# Patient Record
Sex: Male | Born: 1960 | Race: Black or African American | Hispanic: No
Health system: Southern US, Community
[De-identification: ages and names within clinical notes are randomized; demographics above are authoritative.]

## PROBLEM LIST (undated history)

## (undated) DIAGNOSIS — I509 Heart failure, unspecified: Secondary | ICD-10-CM

## (undated) DIAGNOSIS — M1990 Unspecified osteoarthritis, unspecified site: Secondary | ICD-10-CM

## (undated) DIAGNOSIS — R011 Cardiac murmur, unspecified: Secondary | ICD-10-CM

## (undated) DIAGNOSIS — Z9889 Other specified postprocedural states: Secondary | ICD-10-CM

## (undated) DIAGNOSIS — I10 Essential (primary) hypertension: Secondary | ICD-10-CM

## (undated) DIAGNOSIS — E119 Type 2 diabetes mellitus without complications: Secondary | ICD-10-CM

## (undated) DIAGNOSIS — R112 Nausea with vomiting, unspecified: Secondary | ICD-10-CM

## (undated) DIAGNOSIS — D649 Anemia, unspecified: Secondary | ICD-10-CM

## (undated) DIAGNOSIS — N186 End stage renal disease: Secondary | ICD-10-CM

## (undated) DIAGNOSIS — E785 Hyperlipidemia, unspecified: Secondary | ICD-10-CM

## (undated) DIAGNOSIS — E669 Obesity, unspecified: Secondary | ICD-10-CM

## (undated) HISTORY — PX: COLONOSCOPY: SHX174

## (undated) HISTORY — PX: EYE SURGERY: SHX253

## (undated) HISTORY — PX: KNEE ARTHROSCOPY: SUR90

## (undated) HISTORY — PX: KNEE ARTHROSCOPY: SHX127

## (undated) HISTORY — DX: Essential (primary) hypertension: I10

## (undated) HISTORY — DX: Unspecified osteoarthritis, unspecified site: M19.90

## (undated) HISTORY — PX: NOSE SURGERY: SHX723

## (undated) HISTORY — DX: Type 2 diabetes mellitus without complications: E11.9

---

## 2008-02-19 ENCOUNTER — Emergency Department (HOSPITAL_COMMUNITY): Admission: EM | Admit: 2008-02-19 | Discharge: 2008-02-19 | Payer: Self-pay | Admitting: Emergency Medicine

## 2008-02-22 ENCOUNTER — Ambulatory Visit: Payer: Self-pay | Admitting: Family Medicine

## 2008-02-22 ENCOUNTER — Inpatient Hospital Stay (HOSPITAL_COMMUNITY): Admission: RE | Admit: 2008-02-22 | Discharge: 2008-03-01 | Payer: Self-pay | Admitting: General Surgery

## 2009-10-16 ENCOUNTER — Encounter: Admission: RE | Admit: 2009-10-16 | Discharge: 2009-10-16 | Payer: Self-pay | Admitting: Family Medicine

## 2009-12-14 ENCOUNTER — Ambulatory Visit (HOSPITAL_COMMUNITY): Admission: RE | Admit: 2009-12-14 | Discharge: 2009-12-14 | Payer: Self-pay | Admitting: Unknown Physician Specialty

## 2010-11-28 LAB — POCT HEMOGLOBIN-HEMACUE: Hemoglobin: 15.6 g/dL (ref 13.0–17.0)

## 2010-11-28 LAB — GLUCOSE, CAPILLARY: Glucose-Capillary: 331 mg/dL — ABNORMAL HIGH (ref 70–99)

## 2011-01-22 NOTE — Op Note (Signed)
NAME:  Danny Patrick, Danny Patrick NO.:  [id number]   MEDICAL RECORD NO.:  [id number]           PATIENT TYPE:   LOCATION:                                 FACILITY:   PHYSICIAN:  Marland Kitchen T. Hoxworth, M.D.DATE OF BIRTH:  Aug 11, 1961   DATE OF PROCEDURE:  02/25/2008  DATE OF DISCHARGE:                               OPERATIVE REPORT   PREOPERATIVE DIAGNOSES:  1. Status post incision and drainage, large carbuncle posterior neck.  2. New left axillary abscess.   POSTOPERATIVE DIAGNOSES:  1. Status post incision and drainage, large carbuncle posterior neck.  2. New left axillary abscess.   SURGICAL PROCEDURES:  1. Dressing change neck wound under anesthesia.  2. I&D of left axillary abscess.   SURGEON:  Darene Lamer. Hoxworth, M.D.   ANESTHESIA:  General.   BRIEF HISTORY:  [name] is a diabetic male who underwent incision and  drainage of a very large posterior neck carbuncle by Dr. Hulen Skains 3 days  ago.  Pack remains in place.  Now has developed new area of fluctuance  tenderness and swelling in the lateral aspect of the left axilla.  I  have recommended general anesthesia for repacking of his neck wound to  I&D of left axillary abscess, and was brought to the operating room for  this procedure.   DESCRIPTION OF OPERATION:  The patient brought to the operating room,  placed supine position on the operating table and general orotracheal  anesthesia was induced.  He was then carefully rolled onto a side and  packing was removed from the posterior neck wound.  This was very large  wound about 10 cm in diameter.  All the packing was removed and the  wound was quite clean without any necrotic purulence.  It was repacked  loosely with 1-inch Iodoform gauze and sterilely redressed.  Following  this, the left axilla was sterilely prepped and draped.  Correct patient  and procedure were verified.  An area of fluctuance on the lateral  aspect of the axilla up toward the arm was  incised and fairly large  amount of pus and what appeared to be clotted blood, as well was  evacuated.  The cavity was 4-5 cm in diameter.  There was some venous  bleeding along the vein ends and was controlled with figure-of-eight  Vicryl suture with complete hemostasis.  The wound was irrigated.  There  were no loculations.  This was packed with one-quarter inch Iodoform  gauze and dry sterile dressing applied.  The patient taken to recovery  in good condition.     Darene Lamer. Hoxworth, M.D.  Electronically Signed    BTH/MEDQ  D:  02/25/2008  T:  02/25/2008  Job:  [id number]

## 2011-01-22 NOTE — Consult Note (Signed)
NAME:  Danny Patrick, Danny Patrick NO.:  [id number]   MEDICAL RECORD NO.:  [id number]          PATIENT TYPE:  INP   LOCATION:  5149                         FACILITY:  [facility]   PHYSICIAN:  Blane Ohara McDiarmid, M.D.DATE OF BIRTH:  1961-05-13   DATE OF CONSULTATION:  02/27/2008  DATE OF DISCHARGE:                                 CONSULTATION   We are asked to consult by Dr. Dalbert Batman with [facility].   REASON FOR CONSULTATION:  Diabetes.   HISTORY OF PRESENT ILLNESS:  This is a 50 year old male with diabetes  who was admitted for an I&D of a mass at the posterior neck abscess and  carbuncle.  He was also noted to have a left axillary abscess as well  and both were I&D'd in the OR.  The patient tolerated I&D, but he is now  refusing to have his dressing changed and the area packed removed.  The  patient tells me he is a type 2 diabetic.  He says he has been on  Glucotrol and Glucophage in the past and both have caused him to have  heart palpitations.  He was followed by physician in [place] who had him  on diet control and exercise according to the patient.  He states that  his CBGs at home prior to the infection were 85 to the 130s with this  regimen.  He said he had only been on insulin during hospitalizations  and with infections.   PAST MEDICAL HISTORY:  Diabetes, knee and ankle problems.  Denies any  other medical problems.   MEDICATIONS:  Currently in the hospital, he is on Cipro 400 mg IV q.12  h., clindamycin 600 mg IV q.8 h., sliding scale insulin, Lantus 30 units  q.h.s., metformin 500 mg daily, MiraLax 17 g b.i.d., Tylenol 650,  Dilaudid p.r.n., Ativan 0.5 q.6 h. p.r.n., and Zofran p.r.n.   ALLERGIES:  Penicillin.   PAST SURGICAL HISTORY:  He is status post I&D multiple times now and in  past, and right knee arthroplasty.   SOCIAL HISTORY:  He has recently moved from [place].  He lives with his  cousin.  He denies alcohol, tobacco, and drug.  He does  not work.   FAMILY HISTORY:  His mother and sister are with diabetes.  No  hypertension in the family, otherwise noncontributory.   REVIEW OF SYSTEMS:  He complains of constipation and he has not a bowel  movement awhile.  Denies chills or questionable fevers.  Denies  diarrhea.  Denies shortness of breath.  Denies chest pain.  Denies  nausea and vomiting.  Denies melena.  Denies headache.  Denies vision  changes.  Denies edema.  He does complain of neck pain.   PHYSICAL EXAMINATION:  VITAL SIGNS:  Temperature is 98.6, it is also his  T-max.  His heart rate is 80-91, his blood pressure is 108/64 with a  high of 116/88, and his O2 sats are 95% on room air.  GENERAL:  Not in acute distress, obese.  HEENT:  Extraocular muscles are intact.  His posterior scalp has  a large  bandage with blood stains on it.  This area is tender to palpation.  He  does not allow me to remove the bandage.  He has moist mucous membranes.  NECK:  Bandages in place.  CARDIOVASCULAR:  Regular rate and rhythm.  No rubs, gallops, or murmurs.  LUNGS:  Clear to auscultation bilaterally.  No wheezes or crackles.  ABDOMEN:  Obese, soft, and nontender.  MUSCULOSKELETAL:  Full range of movement and 5/5 strength of all  extremities.  EXTREMITIES:  No edema, 2+ DP pulses equal bilaterally.  His left axilla  has one side bandage in place and the area is tender to palpation.  The  patient refuses to allow me to remove the bandage to examine.  NEUROLOGIC:  Cranial nerves II-XII intact.  He is oriented x3.  SKIN:  He has no rashes.  The bandages are in place as described above  in the I&D areas.   LABORATORY DATA:  His CBGs are 400, 342, 383, and 428.  Sodium 134,  potassium 4.3, chloride 97, bicarb 29, BUN 9, and creatinine 0.84.  Platelets 366, hemoglobin A1c 12.7, white count 11.5 down from 17.6,  hemoglobin 11.5, hematocrit 38.6, and platelets 250.  An abscess of the  culture of his axilla, no growth today.  Abscess of  his posterior neck  and head culture is MSSA sensitive to Cipro and clindamycin.   ASSESSMENT:  A 50 year old male with diabetes and methicillin-sensitive  Staphylococcus aureus abscesses.  1. Abscess:  Per [facility], the primary team.  Likely,      this infection is increasing his blood glucose  Of note, the      patient is refusing dressing changes at this point, unless he is      taken to the OR.  2. Diabetes:  Uncontrolled given A1c 12.7.  Likely, this has been      going on for at least 1-2 months.  Probably, his CBGs are more      elevated due to his infection.  The patient has refused Glucotrol      and Glucophage due to side effects.  I will start glyburide 5 mg      and likely we can titrate this up to maximum of 20.  We will      increase the Lantus to 40 units at bedtime and put him on resistant      sliding scale insulin.  We will stop his metformin per his request.  3. Fluids, electrolytes, nutrition:  Low-carb diet.  4. Constipation.  We are going to try senna 2 tablets p.o. at bedtime      along with MiraLax.   DISPOSITION:  Per primary team.      Kasandra Knudsen, M.D.  Electronically Signed      Blane Ohara McDiarmid, M.D.  Electronically Signed    JT/MEDQ  D:  02/27/2008  T:  02/28/2008  Job:  [id number]

## 2011-01-22 NOTE — Op Note (Signed)
NAME:  Danny Patrick, Danny Patrick NO.:  [id number]   MEDICAL RECORD NO.:  [id number]          PATIENT TYPE:  INP   LOCATION:  [id number]                         FACILITY:  [facility]   PHYSICIAN:  Gwenyth Ober, M.D.    DATE OF BIRTH:  [birth year]   DATE OF PROCEDURE:  02/22/2008  DATE OF DISCHARGE:                               OPERATIVE REPORT   PREOPERATIVE DIAGNOSES:  1. Carbuncle/massive abscess of posterior neck.  2. Left parietal scalp abscess.   POSTOPERATIVE DIAGNOSES:  1. Carbuncle/massive abscess of posterior neck.  2. Left parietal scalp abscess.   PROCEDURE:  Incision and drainage of massive posterior neck abscess/  carbuncle and left scalp abscess.   SURGEON:  Gwenyth Ober, M.D.   ANESTHESIA:  General endotracheal.   ESTIMATED BLOOD LOSS:  100 mL.   COMPLICATIONS:  None.   CONDITION:  Fair.   INDICATIONS FOR OPERATION:  The patient is a 53-year gentleman who has  developed a massive posterior-neck abscess who now comes to the hospital  for an incision and drainage, and he also has a scalp abscess that was  being drained at the same time.   FINDINGS:  The patient had about a 100-mL sort-of necrotic abscess on  the back part of his neck which was hypervascular.  The lesion on his  left parietal scalp contained about 3-5 mL of mucopurulent fluid.   OPERATION:  The patient was taken to the operating room, placed on table  initially in the supine position.  After an adequate general  endotracheal anesthetic was administered he was placed in the right  lateral decubitus position exposing the large abscess on his neck and  also the left-scalp abscess.   We drained the neck abscess initially using #10 blade to incise in the  center of this fluctuant mass down into a deep core of brownish,  yellowish, and bloody mucoid pus.  There were multiple loculations which  were broken up with the surgeon's fingers as we irrigated out this large  cavity.  There was  significant bleeding with that, which we cauterized  and also controlled with packing.  We irrigated it with saline solution,  and we subsequently packed it with 2 bottles of 1-inch Iodoform Nu  Gauze.  The bleeding subsided with the significant packing.   We then incised and drained the left parietal scalp abscess, where 3 to  5 mL of pus were removed.  We then packed it with several inches of 1/4-  inch Iodoform Nu Gauze.  Sterile dressings were applied to both wounds.  The patient was placed back on his back prior to being extubated and  transported back to recovery room.  A large dressing was applied,  including a Kerlix which did go loosely around his neck to hold on the  4x4s and ABD dressing.      Gwenyth Ober, M.D.  Electronically Signed     JOW/MEDQ  D:  02/22/2008  T:  02/23/2008  Job:  [id number]

## 2011-01-22 NOTE — Discharge Summary (Signed)
NAME:  REDELL, FRYAR NO.:  [id number]   MEDICAL RECORD NO.:  [id number]          PATIENT TYPE:  INP   LOCATION:  5149                         FACILITY:  [facility]   PHYSICIAN:  Ebony Hail L. Lissa Merlin, N.P. DATE OF BIRTH:  [birth year]   DATE OF ADMISSION:  02/22/2008  DATE OF DISCHARGE:  03/01/2008                               DISCHARGE SUMMARY   ADMITTING PHYSICIAN:  Imogene Burn. Georgette Dover, M.D.   DISCHARGING PHYSICIAN:  Dr. Grandville Silos.   OPERATIVE PHYSICIANS:  Kathryne Eriksson. Hulen Skains, M.D. and Dr. Marland Kitchen T.  Hoxworth.   CONSULTANTS:  ER Med with the Teaching Service.   CHIEF COMPLAINT/REASON FOR ADMISSION:  Mr. Mongillo is a 50 year old male  patient, diabetes, reports as being diet and weight controlled.  He  reports a 2-week history of enlarging posterior neck abscess.  He has  developed small spontaneous amount of drainage from this area, but has  not significantly been able to resolve the abscess without medical  treatment.  On clinical exam, the patient was noted to have a 10 x 15 cm  abscess on the back of the neck.  It is swollen and erythematous and  extremely tender to touch.  The patient was admitted with a diagnosis  of,  1. Posterior neck abscess.  2. Diabetes, diet controlled.   HOSPITAL COURSE:  1. The patient was admitted and taken to the OR where he underwent an      incision and drainage of scalp and massive neck abscesses and      carbuncle, this was by Dr. Hulen Skains. He underwent incision and      drainage of the left scalp, mass of neck abscess and carbuncle.  He      was found to have a mass of 100 mL carbuncle.  Smaller right scalp      abscess, cultures were sent which later returned back positive for      methicillin-sensitive Staph aureus.  The patient had been placed      empirically on antibiotic therapy with vancomycin and Cipro.  This      was later narrowed to Cipro and clindamycin.  2. Regarding his diabetes, his glucose has remained elevated.  He was      started on the [facility] glycemic control protocol and because his      glucose were greater than 300-400 range, he was started on Lantus.      He was also complaining of inability to have a bowel movement, he      refused suppositories so he was started on MiraLax.   On postop day #2, the patient was having significant pain in the left  axilla and on the posterior neck.  His white count was 17,600.  The  packing was removed from the neck wound.  The patient did not tolerate  this well.  In the interim, he refused to have additional packing  removed without anesthesia.  The abscess under the left axilla which  began to develop after admission was likely it needed I&D and on postop  day #3, the patient was  taken back to the OR where he underwent a left  axillary abscess I&D and change of the dressing on the neck under  anesthesia.   From this point on, the patient refused to have any of the dressings  changed at the bedside despite multiple attempts to get the patient  sedation and oral pain medication at the bedside, he kept insisting on  returning to the OR.   Because of the patient's poor control in his diabetes and the fact he is  now established with primary care physician since arriving to town from  [place], Internal Medicine consult was obtained.  Teaching Service saw  the patient, did a complete evaluation on him.  The patient's hemoglobin  A1c was found to be 12.7.  The patient was subsequently started on ACE  inhibitor, lisinopril, and glyburide.  He was tried on Glucophage, but  he reported that this gave him tachypalpitations, so this was stopped.  He was also continued on the Lantus insulin and this was adjusted.  He  also received teaching and education from the diabetes education  coordinators as well as RNs at bedside and plans were to discharge the  patient home on glyburide and inject for insulin as well as continue his  ACE inhibitor.  A urine microalbumin was  checked and this was normal at  0.52.   On postop day 8/4, which was the date of the patient's discharge, repeat  labs showed a white count of 11,000, hemoglobin 1.8, platelets 289,000,  neutrophils 56%.  T-max was 99.1.  The patient was tolerating a diet,  having bowel movements.  With the assistance of the rapid response  nurse, the patient was placed under conscious sedation and subsequently  all packing was removed and not to be placed.  Oil emulsion based  dressings were placed over patient's wound to aid in comfort of removing  dressings in the future.  On the basis both wounds were clean, red, and  granular without any purulent drainage and no periwound induration or  cellulitic changes were noted.  At this point, Internal Medicine has  signed off and has left instructions regarding the patient's diabetes  management.   Because the patient is new to town and has not established with  physician, case management was asked to assist with discharge planning.  The patient has Medicare disability for back pain and he will be able to  obtain all medications via Medicare.  The patient was given the number  of [facility], so he could find a PCP in town.  He has been advised  to do this to assist with management of his diabetes.  In addition, the  patient will be instructed how to administer insulin before discharge  and we will ask for Home Health Nurse to come out to his house for  several visits to make sure he is appropriately managing his insulin in  spite of potential side effects.   FINAL DISCHARGE DIAGNOSES:  1. Posterior scalp abscess, status post incision and drainage.  2. Inferior left axillary abscess, status post intraoperative incision      and drainage.  3. Blood cultures positive for methicillin-sensitive Staph aureus.  4. Diabetes uncontrolled with poor insight into management.      Hemoglobin A1c 12.7, this admission.  5. Hypertension controlled with ACE  inhibitor.  6. Normal urine microalbumins with baseline creatinine, this admission      at 0.84.   DISCHARGE MEDICATIONS:  The patient will stop the Septra  and Vicodin, he  was taking prior to admissions.  New medications include,  1. Glyburide 10 mg daily.  2. Cipro 500 mg b.i.d. for 7 days.  3. Clindamycin 600 mg t.i.d. for 7 days.  4. Percocet 5/325 1-2 tablets 4 hours as needed for pain.  5. Protonix 40 mg daily.  6. Prinivil 2.5 mg daily.  7. Lantus insulin 40 units at bedtime, prescription also given for      syringes.  Return to work not applicable, the patient on      disability.  Diet, carb modified, diabetic appropriate diet.  Wound      care, cover wounds on neck and axilla region if draining.  8. Wound care, shower 2 to 3 times daily.  Keep wounds clean.   ACTIVITY:  May shower.  Otherwise, no restrictions.   OTHER RECOMMENDATIONS:  The patient is to check his blood sugar before  meals and at bedtime.  He is to record these numbers and bring to doctor  visits.   FOLLOW UP:  The patient is to see either Dr. Hulen Skains or Dr. Excell Seltzer in 1-  2 weeks.  For wound evaluation, he is to call [phone] for the  appointment.  He is to establish with the primary medical doctor to  follow the diabetes.  He has been given the number of [facility],  [phone] to call to find the physician name.      Sullivan Lissa Merlin, N.P.     ALE/MEDQ  D:  03/01/2008  T:  03/02/2008  Job:  [id number]   cc:   Gwenyth Ober, M.D.  Darene Lamer. Hoxworth, M.D.

## 2011-01-24 ENCOUNTER — Emergency Department (HOSPITAL_COMMUNITY)
Admission: EM | Admit: 2011-01-24 | Discharge: 2011-01-24 | Discharge status: Against Medical Advice | Payer: Medicare Other | Attending: Emergency Medicine | Admitting: Emergency Medicine

## 2011-01-24 DIAGNOSIS — Z0000 Encounter for general adult medical examination without abnormal findings: Secondary | ICD-10-CM | POA: Insufficient documentation

## 2011-01-24 DIAGNOSIS — R109 Unspecified abdominal pain: Secondary | ICD-10-CM | POA: Insufficient documentation

## 2011-01-24 LAB — GLUCOSE, CAPILLARY

## 2011-06-06 LAB — BASIC METABOLIC PANEL
BUN: 8
BUN: 9
Chloride: 98
Creatinine, Ser: 0.84
Creatinine, Ser: 0.91
GFR calc Af Amer: >60
GFR calc non Af Amer: >60
Glucose, Bld: 285 — ABNORMAL HIGH
Glucose, Bld: 366 — ABNORMAL HIGH
Potassium: 4.5
Sodium: 133 — ABNORMAL LOW

## 2011-06-06 LAB — HEMOGLOBIN A1C: Mean Plasma Glucose: 375

## 2011-06-06 LAB — COMPREHENSIVE METABOLIC PANEL
ALT: 77 — ABNORMAL HIGH
AST: 32
Albumin: 3.2 — ABNORMAL LOW
Alkaline Phosphatase: 253 — ABNORMAL HIGH
BUN: 10
Calcium: 9.6
Creatinine, Ser: 0.99
GFR calc Af Amer: >60
Glucose, Bld: 411 — ABNORMAL HIGH
Potassium: 4.2
Total Protein: 7.7

## 2011-06-06 LAB — CBC
HCT: 34 — ABNORMAL LOW
Hemoglobin: 11.5 — ABNORMAL LOW
MCHC: 34
MCHC: 34.2
MCV: 84.2
MCV: 84.3
Platelets: 289
RBC: 3.99 — ABNORMAL LOW
RBC: 4.08 — ABNORMAL LOW
RDW: 12.6
RDW: 12.7
WBC: 11 — ABNORMAL HIGH
WBC: 11.5 — ABNORMAL HIGH

## 2011-06-06 LAB — CULTURE, ROUTINE-ABSCESS

## 2011-06-06 LAB — ANAEROBIC CULTURE

## 2011-06-06 LAB — DIFFERENTIAL
Basophils Absolute: 0.1
Basophils Relative: 1
Eosinophils Relative: 2
Eosinophils Relative: 3
Lymphs Abs: 1.9
Lymphs Abs: 4
Monocytes Absolute: 1.4 — ABNORMAL HIGH
Neutro Abs: 15.7 — ABNORMAL HIGH
Neutrophils Relative %: 56

## 2011-06-06 LAB — GLUCOSE, RANDOM: Glucose, Bld: 415 — ABNORMAL HIGH

## 2011-10-22 DIAGNOSIS — M1990 Unspecified osteoarthritis, unspecified site: Secondary | ICD-10-CM | POA: Insufficient documentation

## 2011-10-22 DIAGNOSIS — G8929 Other chronic pain: Secondary | ICD-10-CM | POA: Insufficient documentation

## 2012-04-27 ENCOUNTER — Encounter: Payer: Self-pay | Admitting: Gastroenterology

## 2012-05-22 ENCOUNTER — Ambulatory Visit (INDEPENDENT_AMBULATORY_CARE_PROVIDER_SITE_OTHER): Payer: Medicare Other | Admitting: Gastroenterology

## 2012-05-22 ENCOUNTER — Encounter: Payer: Self-pay | Admitting: Gastroenterology

## 2012-05-22 VITALS — BP 140/80 | HR 96 | Ht 68.5 in | Wt 239.0 lb

## 2012-05-22 DIAGNOSIS — E119 Type 2 diabetes mellitus without complications: Secondary | ICD-10-CM

## 2012-05-22 DIAGNOSIS — R52 Pain, unspecified: Secondary | ICD-10-CM

## 2012-05-22 DIAGNOSIS — K5900 Constipation, unspecified: Secondary | ICD-10-CM

## 2012-05-22 DIAGNOSIS — R1084 Generalized abdominal pain: Secondary | ICD-10-CM

## 2012-05-22 DIAGNOSIS — K429 Umbilical hernia without obstruction or gangrene: Secondary | ICD-10-CM

## 2012-05-22 MED ORDER — NA SULFATE-K SULFATE-MG SULF 17.5-3.13-1.6 GM/177ML PO SOLN: 177.0000 mL | ORAL | Status: DC | PRN

## 2012-05-22 MED ORDER — PEG-KCL-NACL-NASULF-NA ASC-C 100 G PO SOLR: 1.0000 | Freq: Once | ORAL | Status: DC

## 2012-05-22 MED ORDER — LACTULOSE 20 G PO PACK: 20.0000 g | PACK | Freq: Three times a day (TID) | ORAL | Status: AC

## 2012-05-22 MED ORDER — LUBIPROSTONE 24 MCG PO CAPS: 24.0000 ug | ORAL_CAPSULE | Freq: Two times a day (BID) | ORAL | Status: AC

## 2012-05-22 NOTE — Progress Notes (Signed)
History of Present Illness:  This is a 51 year old African American male referred by  Ruthell Rummage for evaluation of years of worsening chronic constipation. Patient allegedly may go several weeks without a bowel movement. He does have adult onset diabetes treated with metformin 500 mg twice a day. He denies complications from his diabetes such as visual, renal, or neurological problems. Associated with his constipation is vague abdominal gas, bloating, and occasional abdominal cramping. He underwent recent CT scan of the abdomen and multiple laboratory testing, all was reviewed today was essentially normal. He has not had previous barium studies of his colon or colonoscopy.  The patient relates he tries to follow a regular diet, and alleges that he drinks liberal by mouth fluids. His had no melena, hematochezia, upper GI or hepatobiliary complaints. He denies other neurological problems or urinary voiding difficulties. He does have vague arthralgias and uses 3-4 Aleve tablets a day. Family history is noncontributory. The patient relates he is tried numerous medications which have not helped his constipation in the past. This problem has been going on for greater than 20 years.  I have reviewed this patient's present history, medical and surgical past history, allergies and medications.     ROS: The remainder of the 10 point ROS is negative... he complains of migraine headaches, chronic fatigue, periodic nosebleeds, mild insomnia, arthritis and low back pain.     Physical Exam: Blood pressure 140/80, pulse 96 and regular, and weight 239 pounds with a BMI of 35.81. I cannot appreciate stigmata of chronic liver disease, thyromegaly or lymphadenopathy. General well developed well nourished patient in no acute distress, appearing his stated age Eyes PERRLA, no icterus, fundoscopic exam per opthamologist Skin no lesions noted Neck supple, no adenopathy, no thyroid enlargement, no tenderness Chest  clear to percussion and auscultation Heart no significant murmurs, gallops or rubs noted Abdomen no hepatosplenomegaly masses or tenderness, BS normal. Umbilical hernia present with some tenderness to palpation. Rectal inspection normal no fissures, or fistulae noted.  No masses or tenderness on digital exam. Stool guaiac negative. Hard fecal impaction noted. Extremities no acute joint lesions, edema, phlebitis or evidence of cellulitis. Neurologic patient oriented x 3, cranial nerves intact, no focal neurologic deficits noted. Psychological mental status normal and normal affect.  Assessment and plan: This patient has severe constipation and probable colonic atony. I will treat him with a colonoscopy prep of stimulant laxatives today, then I have recommended a regime of Amitiza 24 mcg twice a day with Chronulac 10 g at bedtime as tolerated. Apparently in the past he has tried MiraLax,Linzess, and fiber supplements without improvement. He may need chronic senna administration. Depending on his results from colonoscopy, he also may need Sitz marker study to further define his type of constipation. I have advised him to continue liberal by mouth fluids and other meds per primary care.. Review of his CT scan was in June of 2013. His had an excellent evaluation by his primary care physician including normal TSH level. Review of his labs shows however poor control of his diabetes. After talking with this patient, it is unclear to me that he is taking his diabetic medications as recommended.   Encounter Diagnoses  Name Primary?  . Blood in stool Yes  . Constipation

## 2012-05-22 NOTE — Patient Instructions (Addendum)
You have been scheduled for a colonoscopy with propofol. Please follow written instructions given to you at your visit today.  Please pick up your prep kit at the pharmacy within the next 1-3 days. If you use inhalers (even only as needed), please bring them with you on the day of your procedure. We have given you samples of the following medication to take: Amitza, Kristolose. If they help please call for prescriptions.  CC: Eldridge Abrahams, M. D.

## 2012-06-19 ENCOUNTER — Encounter: Payer: Medicare Other | Admitting: Gastroenterology

## 2012-06-19 ENCOUNTER — Telehealth: Payer: Self-pay | Admitting: Gastroenterology

## 2012-06-19 NOTE — Telephone Encounter (Signed)
PT BILLED NO SHOW FEE-Per Dr Sharlett Iles must schedule OV prior to rescheduling Colon./yf

## 2012-10-19 DIAGNOSIS — M720 Palmar fascial fibromatosis [Dupuytren]: Secondary | ICD-10-CM | POA: Insufficient documentation

## 2012-12-02 DIAGNOSIS — Z8042 Family history of malignant neoplasm of prostate: Secondary | ICD-10-CM | POA: Insufficient documentation

## 2012-12-24 DIAGNOSIS — E291 Testicular hypofunction: Secondary | ICD-10-CM | POA: Insufficient documentation

## 2013-01-25 ENCOUNTER — Ambulatory Visit: Payer: Medicare Other | Attending: Family Medicine | Admitting: Physical Therapy

## 2013-02-02 ENCOUNTER — Encounter (HOSPITAL_COMMUNITY): Payer: Self-pay | Admitting: *Deleted

## 2013-02-02 ENCOUNTER — Emergency Department (HOSPITAL_COMMUNITY)
Admission: EM | Admit: 2013-02-02 | Discharge: 2013-02-02 | Disposition: A | Discharge status: Home / Self Care | Payer: Medicare Other | Attending: Emergency Medicine | Admitting: Emergency Medicine

## 2013-02-02 ENCOUNTER — Emergency Department (HOSPITAL_COMMUNITY): Payer: Medicare Other

## 2013-02-02 DIAGNOSIS — K5900 Constipation, unspecified: Secondary | ICD-10-CM | POA: Insufficient documentation

## 2013-02-02 DIAGNOSIS — M129 Arthropathy, unspecified: Secondary | ICD-10-CM | POA: Insufficient documentation

## 2013-02-02 DIAGNOSIS — E119 Type 2 diabetes mellitus without complications: Secondary | ICD-10-CM | POA: Insufficient documentation

## 2013-02-02 DIAGNOSIS — Z79899 Other long term (current) drug therapy: Secondary | ICD-10-CM | POA: Insufficient documentation

## 2013-02-02 LAB — CBC WITH DIFFERENTIAL/PLATELET
Basophils Absolute: 0 10*3/uL (ref 0.0–0.1)
Lymphocytes Relative: 31 % (ref 12–46)
Lymphs Abs: 2.6 10*3/uL (ref 0.7–4.0)
Neutro Abs: 5.2 10*3/uL (ref 1.7–7.7)
Neutrophils Relative %: 62 % (ref 43–77)
Platelets: 188 10*3/uL (ref 150–400)
RBC: 5.16 MIL/uL (ref 4.22–5.81)
RDW: 12.6 % (ref 11.5–15.5)
WBC: 8.4 10*3/uL (ref 4.0–10.5)

## 2013-02-02 LAB — URINALYSIS, ROUTINE W REFLEX MICROSCOPIC
Bilirubin Urine: NEGATIVE
Glucose, UA: >1000 mg/dL — AB
Hgb urine dipstick: NEGATIVE
Protein, ur: NEGATIVE mg/dL

## 2013-02-02 LAB — COMPREHENSIVE METABOLIC PANEL
ALT: 11 U/L (ref 0–53)
AST: 11 U/L (ref 0–37)
Alkaline Phosphatase: 88 U/L (ref 39–117)
CO2: 26 mEq/L (ref 19–32)
Calcium: 9.5 mg/dL (ref 8.4–10.5)
Chloride: 98 mEq/L (ref 96–112)
GFR calc non Af Amer: >90 mL/min (ref >90)
Potassium: 4 mEq/L (ref 3.5–5.1)
Sodium: 135 mEq/L (ref 135–145)

## 2013-02-02 LAB — URINE MICROSCOPIC-ADD ON: Urine-Other: NONE SEEN

## 2013-02-02 MED ORDER — ONDANSETRON HCL 4 MG/2ML IJ SOLN
4.0000 mg | Freq: Once | INTRAMUSCULAR | Status: DC
  Filled 2013-02-02: qty 2

## 2013-02-02 MED ORDER — MAGNESIUM CITRATE PO SOLN
1.0000 | Freq: Once | ORAL | Status: AC
  Administered 2013-02-02: 1 via ORAL
  Filled 2013-02-02: qty 296

## 2013-02-02 MED ORDER — SODIUM CHLORIDE 0.9 % IV BOLUS (SEPSIS)
1000.0000 mL | Freq: Once | INTRAVENOUS | Status: AC
  Administered 2013-02-02: 1000 mL via INTRAVENOUS

## 2013-02-02 MED ORDER — HYDROMORPHONE HCL PF 1 MG/ML IJ SOLN
1.0000 mg | Freq: Once | INTRAMUSCULAR | Status: DC
  Filled 2013-02-02: qty 1

## 2013-02-02 MED ORDER — IOHEXOL 300 MG/ML  SOLN
100.0000 mL | Freq: Once | INTRAMUSCULAR | Status: AC | PRN
  Administered 2013-02-02: 100 mL via INTRAVENOUS

## 2013-02-02 MED ORDER — IOHEXOL 300 MG/ML  SOLN
50.0000 mL | Freq: Once | INTRAMUSCULAR | Status: AC | PRN
  Administered 2013-02-02: 50 mL via ORAL

## 2013-02-02 NOTE — ED Provider Notes (Addendum)
History     CSN: [id number]  Arrival date & time 02/02/13  1532   First MD Initiated Contact with Patient 02/02/13 1544      Chief Complaint  Patient presents with  . Abdominal Pain    (Consider location/radiation/quality/duration/timing/severity/associated sxs/prior treatment) Patient is a 52 y.o. male presenting with abdominal pain. The history is provided by the patient.  Abdominal Pain This is a new problem. Episode onset: 4 days. The problem occurs constantly. The problem has been gradually worsening. Associated symptoms include abdominal pain. Pertinent negatives include no chest pain, no headaches and no shortness of breath. Nothing (states the eating does not improve sx) aggravates the symptoms. The symptoms are relieved by NSAIDs. Treatments tried: aleve. The treatment provided mild relief.    Past Medical History  Diagnosis Date  . Arthritis   . Diabetes mellitus     Past Surgical History  Procedure Laterality Date  . Nose surgery    . Knee arthroscopy      Bilateral   . Shoulder arthroscopy      Right     Family History  Problem Relation Age of Onset  . Colon cancer Neg Hx     History  Substance Use Topics  . Smoking status: Never Smoker   . Smokeless tobacco: Never Used  . Alcohol Use: No      Review of Systems  Constitutional: Negative for fever.  Respiratory: Negative for cough and shortness of breath.   Cardiovascular: Negative for chest pain.  Gastrointestinal: Positive for nausea and abdominal pain. Negative for vomiting, diarrhea and constipation.  Genitourinary: Negative for dysuria and frequency.  Neurological: Negative for headaches.  All other systems reviewed and are negative.    Allergies  Penicillins  Home Medications   Current Outpatient Rx  Name  Route  Sig  Dispense  Refill  . glipiZIDE (GLUCOTROL) 5 MG tablet   Oral   Take 10 mg by mouth 2 (two) times daily before a meal.         . glucose blood (FREESTYLE LITE)  test strip   Other   1 each by Other route as directed. Use as instructed         . Ibuprofen (ADVIL PO)   Oral   Take by mouth as needed.         . metFORMIN (GLUCOPHAGE) 500 MG tablet   Oral   Take 500 mg by mouth 2 (two) times daily with a meal.         . Na Sulfate-K Sulfate-Mg Sulf (SUPREP BOWEL PREP) SOLN   Oral   Take 177 mLs by mouth as needed.   1 Bottle   0     Samples of this drug were given to the patient, qu ...   . peg 3350 powder (MOVIPREP) 100 G SOLR   Oral   Take 1 kit (100 g total) by mouth once.   1 kit   0   . saxagliptin HCl (ONGLYZA) 5 MG TABS tablet   Oral   Take 5 mg by mouth daily.           There were no vitals taken for this visit.  Physical Exam  Nursing note and vitals reviewed. Constitutional: He is oriented to person, place, and time. He appears well-developed and well-nourished. No distress.  HENT:  Head: Normocephalic and atraumatic.  Mouth/Throat: Oropharynx is clear and moist.  Eyes: Conjunctivae and EOM are normal. Pupils are equal, round, and reactive to light.  Neck: Normal range of motion. Neck supple.  Cardiovascular: Normal rate, regular rhythm and intact distal pulses.   No murmur heard. Pulmonary/Chest: Effort normal and breath sounds normal. No respiratory distress. He has no wheezes. He has no rales.  Abdominal: Soft. He exhibits no distension. Bowel sounds are decreased. There is tenderness in the right upper quadrant. There is guarding and positive Murphy's sign. There is no rebound and no CVA tenderness. No hernia. Hernia confirmed negative in the right inguinal area and confirmed negative in the left inguinal area.  Generalized abd pain most pronounced in the RUQ  Musculoskeletal: Normal range of motion. He exhibits no edema and no tenderness.  Neurological: He is alert and oriented to person, place, and time.  Skin: Skin is warm and dry. No rash noted. No erythema.  Psychiatric: He has a normal mood and  affect. His behavior is normal.    ED Course  Procedures (including critical care time)  Labs Reviewed  CBC WITH DIFFERENTIAL - Abnormal; Notable for the following:    MCHC 36.6 (*)    All other components within normal limits  COMPREHENSIVE METABOLIC PANEL - Abnormal; Notable for the following:    Glucose, Bld 357 (*)    All other components within normal limits  LIPASE, BLOOD  URINALYSIS, ROUTINE W REFLEX MICROSCOPIC   Ct Abdomen Pelvis W Contrast  02/02/2013   *RADIOLOGY REPORT*  Clinical Data: Abdominal pain  CT ABDOMEN AND PELVIS WITH CONTRAST  Technique:  Multidetector CT imaging of the abdomen and pelvis was performed following the standard protocol during bolus administration of intravenous contrast.  Contrast: 161mL OMNIPAQUE IOHEXOL 300 MG/ML  SOLN, 77mL OMNIPAQUE IOHEXOL 300 MG/ML  SOLN  Comparison: None.  Findings: Lung bases are clear.  No pericardial fluid.  No focal hepatic lesion.  The gallbladder, pancreas, spleen, adrenal glands, and kidneys are normal.  No ureterolithiasis or obstructive uropathy.  Stomach, small bowel, appendix, and cecum are normal.  There is moderate volume stool throughout the colon.  Rectum is normal.  Abdominal aorta normal caliber.  No retroperitoneal periportal lymphadenopathy.  There is no free fluid the pelvis.  Prostate gland and bladder normal.  No pelvic lymphadenopathy.  There is a degenerative spurring of the spine.  No acute findings.  IMPRESSION:  1.  No acute findings in the abdomen or pelvis. 2.  Normal gallbladder and appendix. 3.  Degenerative changes of the spine.   Original Report Authenticated By: Suzy Bouchard, M.D.     1. Constipation       MDM   Patient with 4 days of abdominal pain that is intermittent but worsening. He has diffuse upper abdominal tenderness but worse in the right upper quadrant with guarding and positive Murphy's sign. He denies any vomiting, change in bowel movements or fever. He cannot think of any  exacerbating factors. He denies any prior abdominal surgeries but does have a history of diabetes and arthritis. He denies excessive alcohol use, tobacco use and has not changed any of his diabetic medications recently. He does not take excessive NSAIDs.  Concern for cholecystitis versus pancreatitis versus possible diverticulitis. CBC, CMP, lipase, UA pending. Patient given IV fluids, pain and nausea medication  7:39 PM Labs are normal here. CT shows constipation but no other acute finding      Blanchie Dessert, MD 02/02/13 1939  Blanchie Dessert, MD 02/02/13 [id number]

## 2013-02-02 NOTE — ED Notes (Signed)
Pt c/o abd pain since Saturday. Reports hx of hernia and states "I'm not sure if that is what is acting up."

## 2013-07-27 DIAGNOSIS — I10 Essential (primary) hypertension: Secondary | ICD-10-CM | POA: Insufficient documentation

## 2013-07-27 DIAGNOSIS — Z789 Other specified health status: Secondary | ICD-10-CM | POA: Insufficient documentation

## 2013-11-22 DIAGNOSIS — Z880 Allergy status to penicillin: Secondary | ICD-10-CM | POA: Insufficient documentation

## 2013-11-22 DIAGNOSIS — Y92410 Unspecified street and highway as the place of occurrence of the external cause: Secondary | ICD-10-CM | POA: Insufficient documentation

## 2013-11-22 DIAGNOSIS — S199XXA Unspecified injury of neck, initial encounter: Secondary | ICD-10-CM

## 2013-11-22 DIAGNOSIS — Y9389 Activity, other specified: Secondary | ICD-10-CM | POA: Insufficient documentation

## 2013-11-22 DIAGNOSIS — S0993XA Unspecified injury of face, initial encounter: Secondary | ICD-10-CM | POA: Insufficient documentation

## 2013-11-22 DIAGNOSIS — S99919A Unspecified injury of unspecified ankle, initial encounter: Principal | ICD-10-CM

## 2013-11-22 DIAGNOSIS — Z79899 Other long term (current) drug therapy: Secondary | ICD-10-CM | POA: Insufficient documentation

## 2013-11-22 DIAGNOSIS — Z8739 Personal history of other diseases of the musculoskeletal system and connective tissue: Secondary | ICD-10-CM | POA: Insufficient documentation

## 2013-11-22 DIAGNOSIS — E119 Type 2 diabetes mellitus without complications: Secondary | ICD-10-CM | POA: Insufficient documentation

## 2013-11-22 DIAGNOSIS — S99929A Unspecified injury of unspecified foot, initial encounter: Principal | ICD-10-CM

## 2013-11-22 DIAGNOSIS — S8990XA Unspecified injury of unspecified lower leg, initial encounter: Secondary | ICD-10-CM | POA: Insufficient documentation

## 2013-11-23 ENCOUNTER — Emergency Department (HOSPITAL_COMMUNITY)
Admission: EM | Admit: 2013-11-23 | Discharge: 2013-11-23 | Disposition: A | Discharge status: Home / Self Care | Payer: Medicare Other | Attending: Emergency Medicine | Admitting: Emergency Medicine

## 2013-11-23 ENCOUNTER — Emergency Department (HOSPITAL_COMMUNITY): Payer: Medicare Other

## 2013-11-23 ENCOUNTER — Encounter (HOSPITAL_COMMUNITY): Payer: Self-pay | Admitting: Emergency Medicine

## 2013-11-23 DIAGNOSIS — M25562 Pain in left knee: Secondary | ICD-10-CM

## 2013-11-23 DIAGNOSIS — M542 Cervicalgia: Secondary | ICD-10-CM

## 2013-11-23 MED ORDER — MELOXICAM 7.5 MG PO TABS: 15.0000 mg | ORAL_TABLET | Freq: Every day | ORAL | Status: DC

## 2013-11-23 MED ORDER — METHOCARBAMOL 500 MG PO TABS: 500.0000 mg | ORAL_TABLET | Freq: Two times a day (BID) | ORAL | Status: DC
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
## 2013-11-26 NOTE — ED Provider Notes (Signed)
Medical screening examination/treatment/procedure(s) were performed by non-physician practitioner and as supervising physician I was immediately available for consultation/collaboration.   EKG Interpretation None       Babette Relic, MD 11/26/13 [phone]

## 2014-04-07 DIAGNOSIS — M129 Arthropathy, unspecified: Secondary | ICD-10-CM | POA: Diagnosis present

## 2014-04-07 DIAGNOSIS — L02619 Cutaneous abscess of unspecified foot: Secondary | ICD-10-CM | POA: Diagnosis present

## 2014-04-07 DIAGNOSIS — E1169 Type 2 diabetes mellitus with other specified complication: Principal | ICD-10-CM | POA: Diagnosis present

## 2014-04-07 DIAGNOSIS — L03119 Cellulitis of unspecified part of limb: Secondary | ICD-10-CM

## 2014-04-07 DIAGNOSIS — D649 Anemia, unspecified: Secondary | ICD-10-CM | POA: Diagnosis present

## 2014-04-08 ENCOUNTER — Emergency Department (HOSPITAL_COMMUNITY): Payer: Medicare Other

## 2014-04-08 ENCOUNTER — Inpatient Hospital Stay (HOSPITAL_COMMUNITY)
Admission: EM | Admit: 2014-04-08 | Discharge: 2014-04-08 | DRG: 638 | Discharge status: Against Medical Advice | Payer: Medicare Other | Attending: Internal Medicine | Admitting: Internal Medicine

## 2014-04-08 ENCOUNTER — Encounter (HOSPITAL_COMMUNITY): Payer: Self-pay | Admitting: Emergency Medicine

## 2014-04-08 DIAGNOSIS — L02619 Cutaneous abscess of unspecified foot: Secondary | ICD-10-CM

## 2014-04-08 DIAGNOSIS — L03119 Cellulitis of unspecified part of limb: Secondary | ICD-10-CM

## 2014-04-08 DIAGNOSIS — D649 Anemia, unspecified: Secondary | ICD-10-CM

## 2014-04-08 DIAGNOSIS — E119 Type 2 diabetes mellitus without complications: Secondary | ICD-10-CM | POA: Diagnosis present

## 2014-04-08 DIAGNOSIS — R739 Hyperglycemia, unspecified: Secondary | ICD-10-CM

## 2014-04-08 DIAGNOSIS — L988 Other specified disorders of the skin and subcutaneous tissue: Secondary | ICD-10-CM

## 2014-04-08 DIAGNOSIS — L089 Local infection of the skin and subcutaneous tissue, unspecified: Secondary | ICD-10-CM

## 2014-04-08 DIAGNOSIS — E11628 Type 2 diabetes mellitus with other skin complications: Secondary | ICD-10-CM | POA: Diagnosis present

## 2014-04-08 DIAGNOSIS — E1169 Type 2 diabetes mellitus with other specified complication: Secondary | ICD-10-CM | POA: Diagnosis present

## 2014-04-08 DIAGNOSIS — M129 Arthropathy, unspecified: Secondary | ICD-10-CM | POA: Diagnosis present

## 2014-04-08 DIAGNOSIS — L03116 Cellulitis of left lower limb: Secondary | ICD-10-CM

## 2014-04-08 LAB — CBC WITH DIFFERENTIAL/PLATELET
BASOS PCT: 0 % (ref 0–1)
Basophils Absolute: 0 10*3/uL (ref 0.0–0.1)
EOS ABS: 0.1 10*3/uL (ref 0.0–0.7)
EOS PCT: 1 % (ref 0–5)
HCT: 33.6 % — ABNORMAL LOW (ref 39.0–52.0)
HEMOGLOBIN: 11.8 g/dL — AB (ref 13.0–17.0)
LYMPHS ABS: 1.5 10*3/uL (ref 0.7–4.0)
Lymphocytes Relative: 10 % — ABNORMAL LOW (ref 12–46)
MCH: 27.6 pg (ref 26.0–34.0)
MCHC: 35.1 g/dL (ref 30.0–36.0)
MCV: 78.7 fL (ref 78.0–100.0)
MONO ABS: 1.7 10*3/uL — AB (ref 0.1–1.0)
MONOS PCT: 11 % (ref 3–12)
NEUTROS PCT: 78 % — AB (ref 43–77)
Neutro Abs: 11.9 10*3/uL — ABNORMAL HIGH (ref 1.7–7.7)
Platelets: 184 10*3/uL (ref 150–400)
RBC: 4.27 MIL/uL (ref 4.22–5.81)
RDW: 12.5 % (ref 11.5–15.5)
WBC: 15.2 10*3/uL — ABNORMAL HIGH (ref 4.0–10.5)

## 2014-04-08 LAB — GLUCOSE, CAPILLARY: GLUCOSE-CAPILLARY: 274 mg/dL — AB (ref 70–99)

## 2014-04-08 LAB — CBG MONITORING, ED: Glucose-Capillary: 461 mg/dL — ABNORMAL HIGH (ref 70–99)

## 2014-04-08 LAB — BASIC METABOLIC PANEL
Anion gap: 18 — ABNORMAL HIGH (ref 5–15)
BUN: 14 mg/dL (ref 6–23)
CALCIUM: 9 mg/dL (ref 8.4–10.5)
CO2: 23 mEq/L (ref 19–32)
CREATININE: 1.04 mg/dL (ref 0.50–1.35)
Chloride: 91 mEq/L — ABNORMAL LOW (ref 96–112)
GFR, EST NON AFRICAN AMERICAN: 81 mL/min — AB (ref >90)
GLUCOSE: 415 mg/dL — AB (ref 70–99)
POTASSIUM: 4 meq/L (ref 3.7–5.3)
Sodium: 132 mEq/L — ABNORMAL LOW (ref 137–147)

## 2014-04-08 MED ORDER — ONDANSETRON HCL 4 MG PO TABS: 4.0000 mg | ORAL_TABLET | Freq: Four times a day (QID) | ORAL | Status: DC | PRN

## 2014-04-08 MED ORDER — ACETAMINOPHEN 325 MG PO TABS: 650.0000 mg | ORAL_TABLET | Freq: Four times a day (QID) | ORAL | Status: DC | PRN

## 2014-04-08 MED ORDER — MORPHINE SULFATE 2 MG/ML IJ SOLN: 2.0000 mg | INTRAMUSCULAR | Status: DC | PRN

## 2014-04-08 MED ORDER — LEVOFLOXACIN IN D5W 500 MG/100ML IV SOLN
500.0000 mg | INTRAVENOUS | Status: DC
  Filled 2014-04-08: qty 100

## 2014-04-08 MED ORDER — INSULIN ASPART 100 UNIT/ML ~~LOC~~ SOLN: 0.0000 [IU] | SUBCUTANEOUS | Status: DC

## 2014-04-08 MED ORDER — INSULIN ASPART 100 UNIT/ML ~~LOC~~ SOLN
10.0000 [IU] | Freq: Once | SUBCUTANEOUS | Status: AC
  Administered 2014-04-08: 10 [IU] via INTRAVENOUS
  Filled 2014-04-08: qty 1

## 2014-04-08 MED ORDER — ONDANSETRON HCL 4 MG/2ML IJ SOLN: 4.0000 mg | Freq: Four times a day (QID) | INTRAMUSCULAR | Status: DC | PRN

## 2014-04-08 MED ORDER — SODIUM CHLORIDE 0.9 % IV SOLN
INTRAVENOUS | Status: DC
  Administered 2014-04-08: 06:00:00 via INTRAVENOUS

## 2014-04-08 MED ORDER — VANCOMYCIN HCL IN DEXTROSE 1-5 GM/200ML-% IV SOLN
1000.0000 mg | Freq: Three times a day (TID) | INTRAVENOUS | Status: DC
  Filled 2014-04-08: qty 200

## 2014-04-08 MED ORDER — DOCUSATE SODIUM 100 MG PO CAPS: 100.0000 mg | ORAL_CAPSULE | Freq: Two times a day (BID) | ORAL | Status: DC

## 2014-04-08 MED ORDER — HYDROCODONE-ACETAMINOPHEN 5-325 MG PO TABS: 1.0000 | ORAL_TABLET | ORAL | Status: DC | PRN

## 2014-04-08 MED ORDER — ACETAMINOPHEN 650 MG RE SUPP
650.0000 mg | Freq: Four times a day (QID) | RECTAL | Status: DC | PRN
Start: 2014-04-08 — End: 2014-04-08

## 2014-04-08 MED ORDER — VANCOMYCIN HCL IN DEXTROSE 1-5 GM/200ML-% IV SOLN
1000.0000 mg | Freq: Once | INTRAVENOUS | Status: AC
  Administered 2014-04-08: 1000 mg via INTRAVENOUS
  Filled 2014-04-08: qty 200

## 2014-04-08 NOTE — ED Notes (Signed)
Spoke with Dr. Roxanne Mins regarding pt's foot.  Verbal orders given for CBC and BMET.

## 2014-04-08 NOTE — ED Notes (Signed)
Further examination of foot revealed discoloration of top of foot with areas which appear to be fluid filled. Focal point appears to be left 4th toe. Foul odor noted coming from foot upon inspection. Patient reports diabetes history.

## 2014-04-08 NOTE — ED Notes (Signed)
Tilda Burrow RN notified of CBG results 461 mg/dL
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
## 2014-04-08 NOTE — Progress Notes (Signed)
Pt arrived on unit, alert and oriented. Able to make needs known. Oriented to room and staff. RFA IV clean dry and intact. Noted cellulitis on L foot. We will treat per MD order.

## 2014-04-08 NOTE — Progress Notes (Signed)
Received pt report from Anna,RN-ED.

## 2014-04-08 NOTE — Progress Notes (Signed)
Utilization review completed. Pammy Vesey, RN, BSN. 

## 2014-04-08 NOTE — Progress Notes (Signed)
Pt left from the unit without signing AMA sheet even 2RNs explained the risks associated with leaving the facility. [place],MD made aware of pt decision. IV d/c'd.Pt took his personal belongings with him.
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
## 2014-04-08 NOTE — H&P (Signed)
PCP:   [name], NP    Chief Complaint:  Left foot swelling  HPI: Danny Patrick. is a 53 y.o. male   has a past medical history of Arthritis and Diabetes mellitus.   Presented with  patient states over past 3 days his foot has began to swell with some erythema and blistering on the top. He have had some chills but no fever. Patient is a diabetic not on insulin. Reports poor BG control over past few days.  Plain films showing no evidence of osteomyelitis.  Hospitalist was called for admission for diabetic foot infection  Review of Systems:    Pertinent positives include: Fevers, chills,  Constitutional:  No weight loss, night sweats,  fatigue, weight loss  HEENT:  No headaches, Difficulty swallowing,Tooth/dental problems,Sore throat,  No sneezing, itching, ear ache, nasal congestion, post nasal drip,  Cardio-vascular:  No chest pain, Orthopnea, PND, anasarca, dizziness, palpitations.no Bilateral lower extremity swelling  GI:  No heartburn, indigestion, abdominal pain, nausea, vomiting, diarrhea, change in bowel habits, loss of appetite, melena, blood in stool, hematemesis Resp:  no shortness of breath at rest. No dyspnea on exertion, No excess mucus, no productive cough, No non-productive cough, No coughing up of blood.No change in color of mucus.No wheezing. Skin:  no rash or lesions. No jaundice GU:  no dysuria, change in color of urine, no urgency or frequency. No straining to urinate.  No flank pain.  Musculoskeletal:  No joint pain or no joint swelling. No decreased range of motion. No back pain.  Psych:  No change in mood or affect. No depression or anxiety. No memory loss.  Neuro: no localizing neurological complaints, no tingling, no weakness, no double vision, no gait abnormality, no slurred speech, no confusion  Otherwise ROS are negative except for above, 10 systems were reviewed  Past Medical History: Past Medical History  Diagnosis Date  .  Arthritis   . Diabetes mellitus    Past Surgical History  Procedure Laterality Date  . Nose surgery    . Knee arthroscopy      Bilateral   . Shoulder arthroscopy      Right      Medications: Prior to Admission medications   Medication Sig Start Date End Date Taking? Authorizing Provider  glipiZIDE (GLUCOTROL) 5 MG tablet Take 10 mg by mouth 2 (two) times daily before a meal.   Yes Historical Provider, MD  metFORMIN (GLUCOPHAGE) 500 MG tablet Take 500 mg by mouth 2 (two) times daily with a meal.   Yes Historical Provider, MD    Allergies:   Allergies  Allergen Reactions  . Chocolate Other (See Comments)    migraines  . Penicillins Swelling    Pain     Social History:  Ambulatory independently Lives at home alone    reports that he has never smoked. He has never used smokeless tobacco. He reports that he does not drink alcohol or use illicit drugs.    Family History: family history includes Diabetes type II in his mother; Multiple sclerosis in his sister. There is no history of Colon cancer.    Physical Exam: Patient Vitals for the past 24 hrs:  BP Temp Temp src Pulse Resp SpO2 Height Weight  04/08/14 0330 133/69 mmHg - - 106 21 97 % - -  04/08/14 0216 136/76 mmHg - - 105 23 98 % - -  04/08/14 0202 134/71 mmHg - - 105 - 100 % - -  04/07/14 2352 162/82 mmHg  98.7 F (37.1 C) Oral 101 16 98 % 5\' 7"  (1.702 m) 102.059 kg (225 lb)    1. General:  in No Acute distress 2. Psychological: Alert and  Oriented 3. Head/ENT:   Moist  Mucous Membranes                          Head Non traumatic, neck supple                          NormalDentition 4. SKIN: normalSkin turgor,  Skin clean Dry left foot significant for bulla formation on the dorsum with erythema and heat 5. Heart: Regular rate and rhythm no Murmur, Rub or gallop 6. Lungs: Clear to auscultation bilaterally, no wheezes or crackles   7. Abdomen: Soft, non-tender, Non distended 8. Lower extremities: no clubbing,  cyanosis, or edema on the right, left foot appears swollen.   9. Neurologically Grossly intact, moving all 4 extremities equally 10. MSK: Normal range of motion  body mass index is 35.23 kg/(m^2).   Labs on Admission:   Recent Labs  04/08/14 0227  NA 132*  K 4.0  CL 91*  CO2 23  GLUCOSE 415*  BUN 14  CREATININE 1.04  CALCIUM 9.0   No results found for this basename: AST, ALT, ALKPHOS, BILITOT, PROT, ALBUMIN,  in the last 72 hours No results found for this basename: LIPASE, AMYLASE,  in the last 72 hours  Recent Labs  04/08/14 0227  WBC 15.2*  NEUTROABS 11.9*  HGB 11.8*  HCT 33.6*  MCV 78.7  PLT 184   No results found for this basename: CKTOTAL, CKMB, CKMBINDEX, TROPONINI,  in the last 72 hours No results found for this basename: TSH, T4TOTAL, FREET3, T3FREE, THYROIDAB,  in the last 72 hours No results found for this basename: VITAMINB12, FOLATE, FERRITIN, TIBC, IRON, RETICCTPCT,  in the last 72 hours Lab Results  Component Value Date   HGBA1C  Value: 12.7 (NOTE)   The ADA recommends the following therapeutic goals for glycemic   control related to Hgb A1C measurement:   Goal of Therapy:   < 7.0% Hgb A1C   Action Suggested:  > 8.0% Hgb A1C   Ref:  Diabetes Care, 22, Suppl. 1, 1999* 02/24/2008    Estimated Creatinine Clearance: 95.7 ml/min (by C-G formula based on Cr of 1.04). ABG No results found for this basename: phart, pco2, po2, hco3, tco2, acidbasedef, o2sat     No results found for this basename: DDIMER    BNP (last 3 results) No results found for this basename: PROBNP,  in the last 8760 hours  Filed Weights   04/07/14 2352  Weight: 102.059 kg (225 lb)     Cultures:    Component Value Date/Time   SDES ABSCESS 02/25/2008 1004   SDES ABSCESS 02/25/2008 1004   [place], PT ON VANCO 02/25/2008 1004   SPECREQUEST LEFT AXILLA, PT ON VANCO 02/25/2008 1004   CULT NO ANAEROBES ISOLATED 02/25/2008 1004   CULT  Value: MULTIPLE ORGANISMS PRESENT,  NONE PREDOMINANT Note: NO STAPHYLOCOCCUS AUREUS ISOLATED NO GROUP A STREP (S.PYOGENES) ISOLATED 02/25/2008 1004   REPTSTATUS 03/01/2008 FINAL 02/25/2008 1004   REPTSTATUS 02/28/2008 FINAL 02/25/2008 1004         Radiological Exams on Admission: Dg Foot Complete Left  04/08/2014   CLINICAL DATA:  Left foot pain and swelling. Redness and infection around the fourth toe.  EXAM: LEFT FOOT -  COMPLETE 3+ VIEW  COMPARISON:  None.  FINDINGS: Degenerative changes in the interphalangeal joints and first metatarsal-phalangeal joint. Degenerative changes in the intertarsal joints. No evidence of acute fracture or dislocation. No bone erosions. Soft tissues are unremarkable.  IMPRESSION: Degenerative changes in the left foot. No acute bony abnormalities appreciated.   Electronically Signed   By: Lucienne Capers M.D.   On: 04/08/2014 04:00    Chart has been reviewed  Assessment/Plan  53 yo M with diabetes and left foot cellulitis being evaluated for osteomyelitis   Present on Admission:  . Diabetic foot infection - cover with vanc and Levaquin, patient is PEN allergic. MRI in AM . Diabetes mellitus - hold PO meds, ordered ssi   Prophylaxis: SCD, Protonix  CODE STATUS:  FULL CODE  Other plan as per orders.  I have spent a total of 55 min on this admission  Diamon Reddinger 04/08/2014, 4:12 AM  [facility]  Pager [phone]   If 7AM-7PM, please contact the day team taking care of the patient  [email or web address]  Password [email or web address]

## 2014-04-08 NOTE — Progress Notes (Signed)
Pt c/o being hungry. Explained that pt diet is NPO for possible surgery. Pt stated that he wasn't told in the ED for possible procedure done. Pt was aggravated, he threatened to leave. [place],MD made aware.

## 2014-04-08 NOTE — ED Provider Notes (Signed)
CSN: [id number]     Arrival date & time 04/07/14  2338 History   First MD Initiated Contact with Patient 04/08/14 [phone]     Chief Complaint  Patient presents with  . Leg Swelling     (Consider location/radiation/quality/duration/timing/severity/associated sxs/prior Treatment) The history is provided by the patient.   53 year old male who is diabetic has noted swelling in his left foot and lower leg over the last 3 days. This seems to be getting worse. He denies pain and denies fever but has had chills and sweats. He denies any trauma.  Past Medical History  Diagnosis Date  . Arthritis   . Diabetes mellitus    Past Surgical History  Procedure Laterality Date  . Nose surgery    . Knee arthroscopy      Bilateral   . Shoulder arthroscopy      Right    Family History  Problem Relation Age of Onset  . Colon cancer Neg Hx    History  Substance Use Topics  . Smoking status: Never Smoker   . Smokeless tobacco: Never Used  . Alcohol Use: No    Review of Systems  All other systems reviewed and are negative.     Allergies  Chocolate and Penicillins  Home Medications   Prior to Admission medications   Medication Sig Start Date End Date Taking? Authorizing Provider  glipiZIDE (GLUCOTROL) 5 MG tablet Take 10 mg by mouth 2 (two) times daily before a meal.   Yes Historical Provider, MD  metFORMIN (GLUCOPHAGE) 500 MG tablet Take 500 mg by mouth 2 (two) times daily with a meal.   Yes Historical Provider, MD   BP 136/76  Pulse 105  Temp(Src) 98.7 F (37.1 C) (Oral)  Resp 23  Ht 5\' 7"  (1.702 m)  Wt 225 lb (102.059 kg)  BMI 35.23 kg/m2  SpO2 98% Physical Exam  Nursing note and vitals reviewed.  53 year old male, resting comfortably and in no acute distress. Vital signs are significant for borderline tachycardia with heart rate 105, and mild tachypnea with respiratory rate of 23. Oxygen saturation is 98%, which is normal. Head is normocephalic and atraumatic. PERRLA, EOMI.  Oropharynx is clear. Neck is nontender and supple without adenopathy or JVD. Back is nontender and there is no CVA tenderness. Lungs are clear without rales, wheezes, or rhonchi. Chest is nontender. Heart has regular rate and rhythm with 2/6 systolic ejection murmur along the left sternal border. Abdomen is soft, flat, nontender without masses or hepatosplenomegaly and peristalsis is normoactive. Extremities: Left fourth toe is swollen and intensely erythematous. There is a vesicle which is over the dorsum of the proximal phalanx of the toe and extends onto the dorsum of the foot. The entire left foot has moderate swelling and moderate erythema and is warm to touch. Swelling extends into the left calf which is approximately 3 cm greater in circumference than the right calf. There no cords palpable but the calf is still warm to touch compared with the contralateral side. No lymphangitic streaks are seen and there is no inguinal adenopathy. Skin is warm and dry without rash. Neurologic: Mental status is normal, cranial nerves are intact, there are no motor or sensory deficits. No evidence of peripheral neuropathy.  ED Course  Procedures (including critical care time) Labs Review Labs Reviewed  CBC WITH DIFFERENTIAL - Abnormal; Notable for the following:    WBC 15.2 (*)    Hemoglobin 11.8 (*)    HCT 33.6 (*)  Neutrophils Relative % 78 (*)    Neutro Abs 11.9 (*)    Lymphocytes Relative 10 (*)    Monocytes Absolute 1.7 (*)    All other components within normal limits  BASIC METABOLIC PANEL - Abnormal; Notable for the following:    Sodium 132 (*)    Chloride 91 (*)    Glucose, Bld 415 (*)    GFR calc non Af Amer 81 (*)    Anion gap 18 (*)    All other components within normal limits  CBG MONITORING, ED - Abnormal; Notable for the following:    Glucose-Capillary 461 (*)    All other components within normal limits    Imaging Review Dg Foot Complete Left  04/08/2014   CLINICAL DATA:   Left foot pain and swelling. Redness and infection around the fourth toe.  EXAM: LEFT FOOT - COMPLETE 3+ VIEW  COMPARISON:  None.  FINDINGS: Degenerative changes in the interphalangeal joints and first metatarsal-phalangeal joint. Degenerative changes in the intertarsal joints. No evidence of acute fracture or dislocation. No bone erosions. Soft tissues are unremarkable.  IMPRESSION: Degenerative changes in the left foot. No acute bony abnormalities appreciated.   Electronically Signed   By: Lucienne Capers M.D.   On: 04/08/2014 04:00   MDM   Final diagnoses:  Cellulitis of left foot  Hyperglycemia  Normochromic normocytic anemia    Cellulitis of the left foot and leg. Original infection appears to be in the left fourth toe. He is started on antibiotics of vancomycin. Laboratory workup has shown moderate hyperglycemia with blood sugar 415 and leukocytosis with WBC of 15.2 as well as anemia with hemoglobin of 11.8. Case is discussed with Dr. Roel Cluck of [facility] who agrees to admit the patient.     Delora Fuel, MD [id number] [id number]

## 2014-04-08 NOTE — Discharge Summary (Signed)
Was notified by nursing staff that patient threatening to leave AMA. Patient states he is hungry and wants to eat. Patient has been explained and he was made n.p.o. because of possibility of needing an  operative intervention on his foot. Was notified that patient left AMA. No prescriptions were provided.  Danny Patrick 7:01 AM

## 2014-04-11 LAB — GLUCOSE, CAPILLARY: GLUCOSE-CAPILLARY: 401 mg/dL — AB (ref 70–99)
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
## 2015-07-16 DIAGNOSIS — E559 Vitamin D deficiency, unspecified: Secondary | ICD-10-CM | POA: Insufficient documentation

## 2015-07-16 DIAGNOSIS — E1165 Type 2 diabetes mellitus with hyperglycemia: Secondary | ICD-10-CM | POA: Insufficient documentation

## 2016-01-02 DIAGNOSIS — E1129 Type 2 diabetes mellitus with other diabetic kidney complication: Secondary | ICD-10-CM | POA: Insufficient documentation

## 2016-02-21 DIAGNOSIS — K5909 Other constipation: Secondary | ICD-10-CM | POA: Insufficient documentation

## 2016-02-21 DIAGNOSIS — Z8371 Family history of colonic polyps: Secondary | ICD-10-CM | POA: Insufficient documentation

## 2016-03-21 DIAGNOSIS — M5020 Other cervical disc displacement, unspecified cervical region: Secondary | ICD-10-CM | POA: Insufficient documentation

## 2016-06-14 DIAGNOSIS — E113519 Type 2 diabetes mellitus with proliferative diabetic retinopathy with macular edema, unspecified eye: Secondary | ICD-10-CM | POA: Insufficient documentation

## 2017-12-16 ENCOUNTER — Ambulatory Visit (INDEPENDENT_AMBULATORY_CARE_PROVIDER_SITE_OTHER): Payer: Medicare Other | Admitting: Podiatry

## 2017-12-16 DIAGNOSIS — E1165 Type 2 diabetes mellitus with hyperglycemia: Secondary | ICD-10-CM

## 2017-12-16 DIAGNOSIS — B351 Tinea unguium: Secondary | ICD-10-CM | POA: Diagnosis not present

## 2017-12-16 DIAGNOSIS — M79675 Pain in left toe(s): Secondary | ICD-10-CM

## 2017-12-16 DIAGNOSIS — M79674 Pain in right toe(s): Secondary | ICD-10-CM

## 2017-12-16 NOTE — Patient Instructions (Signed)

## 2017-12-17 NOTE — Progress Notes (Signed)
Subjective:   Patient ID: Danny Patrick., male   DOB: 57 y.o.   MRN: [id number]   HPI 57 year old male presents the office today for concerns of thick, painful, elongated toenails that he cannot trim himself.  He denies any redness or drainage or increased to the toenail sites.  The nails do cause pain with pressure in shoes.  He is diabetic and his last A1c was 14.  He has no other concerns today.  Denies any recent ulceration although he does have a history of an ulceration of the left foot about 4 years ago.   Review of Systems  All other systems reviewed and are negative.  Past Medical History:  Diagnosis Date  . Arthritis   . Diabetes mellitus     Past Surgical History:  Procedure Laterality Date  . KNEE ARTHROSCOPY     Bilateral   . NOSE SURGERY    . SHOULDER ARTHROSCOPY     Right      Current Outpatient Medications:  .  glipiZIDE (GLUCOTROL) 5 MG tablet, Take 10 mg by mouth 2 (two) times daily before a meal., Disp: , Rfl:  .  metFORMIN (GLUCOPHAGE) 500 MG tablet, Take 500 mg by mouth 2 (two) times daily with a meal., Disp: , Rfl:   Allergies  Allergen Reactions  . Chocolate Other (See Comments)    migraines  . Penicillins Swelling    Pain     Social History   Socioeconomic History  . Marital status: Divorced    Spouse name: Not on file  . Number of children: 2  . Years of education: Not on file  . Highest education level: Not on file  Occupational History  . Occupation: Unemployed   Social Needs  . Financial resource strain: Not on file  . Food insecurity:    Worry: Not on file    Inability: Not on file  . Transportation needs:    Medical: Not on file    Non-medical: Not on file  Tobacco Use  . Smoking status: Never Smoker  . Smokeless tobacco: Never Used  Substance and Sexual Activity  . Alcohol use: No  . Drug use: No  . Sexual activity: Not on file  Lifestyle  . Physical activity:    Days per week: Not on file    Minutes per session: Not on  file  . Stress: Not on file  Relationships  . Social connections:    Talks on phone: Not on file    Gets together: Not on file    Attends religious service: Not on file    Active member of club or organization: Not on file    Attends meetings of clubs or organizations: Not on file    Relationship status: Not on file  . Intimate partner violence:    Fear of current or ex partner: Not on file    Emotionally abused: Not on file    Physically abused: Not on file    Forced sexual activity: Not on file  Other Topics Concern  . Not on file  Social History Narrative   No caffeine          Objective:  Physical Exam  General: AAO x3, NAD  Dermatological: Nails are hypertrophic, dystrophic, brittle, discolored, elongated 10. No surrounding redness or drainage. Tenderness nails 1-5 bilaterally. No open lesions or pre-ulcerative lesions are identified today.  Vascular: Dorsalis Pedis artery and Posterior Tibial artery pedal pulses are 2/4 bilateral with immedate capillary fill time. There  is no pain with calf compression, swelling, warmth, erythema.   Neruologic: Grossly intact via light touch bilateral.  Protective threshold with Semmes Wienstein monofilament intact to all pedal sites bilateral.   Musculoskeletal: No gross boney pedal deformities bilateral. No pain, crepitus, or limitation noted with foot and ankle range of motion bilateral. Muscular strength 5/5 in all groups tested bilateral.  Gait: Unassisted, Nonantalgic.       Assessment:   57 year old uncontrolled diabetic with symptomatic onychomycosis     Plan:  -Treatment options discussed including all alternatives, risks, and complications -Etiology of symptoms were discussed -Nails debrided 10 without complications or bleeding. Discussed treatment options for onychomycosis per this point we will-proceed with routine debridements. -Daily foot inspection -Follow-up in 3 months or sooner if any problems arise. In the  meantime, encouraged to call the office with any questions, concerns, change in symptoms.   Celesta Gentile, DPM

## 2017-12-23 ENCOUNTER — Encounter: Payer: Self-pay | Admitting: *Deleted

## 2017-12-24 ENCOUNTER — Encounter: Payer: Self-pay | Admitting: Neurology

## 2017-12-24 ENCOUNTER — Ambulatory Visit (INDEPENDENT_AMBULATORY_CARE_PROVIDER_SITE_OTHER): Payer: Medicare Other | Admitting: Neurology

## 2017-12-24 VITALS — BP 140/80 | HR 100 | Ht 69.0 in | Wt 261.0 lb

## 2017-12-24 DIAGNOSIS — M5416 Radiculopathy, lumbar region: Secondary | ICD-10-CM

## 2017-12-24 NOTE — Patient Instructions (Signed)
[place] for lumbar spine injections EMG/NCS   Back Pain, Adult Back pain is very common. The pain often gets better over time. The cause of back pain is usually not dangerous. Most people can learn to manage their back pain on their own. Follow these instructions at home: Watch your back pain for any changes. The following actions may help to lessen any pain you are feeling:  Stay active. Start with short walks on flat ground if you can. Try to walk farther each day.  Exercise regularly as told by your doctor. Exercise helps your back heal faster. It also helps avoid future injury by keeping your muscles strong and flexible.  Do not sit, drive, or stand in one place for more than 30 minutes.  Do not stay in bed. Resting more than 1-2 days can slow down your recovery.  Be careful when you bend or lift an object. Use good form when lifting: ? Bend at your knees. ? Keep the object close to your body. ? Do not twist.  Sleep on a firm mattress. Lie on your side, and bend your knees. If you lie on your back, put a pillow under your knees.  Take medicines only as told by your doctor.  Put ice on the injured area. ? Put ice in a plastic bag. ? Place a towel between your skin and the bag. ? Leave the ice on for 20 minutes, 2-3 times a day for the first 2-3 days. After that, you can switch between ice and heat packs.  Avoid feeling anxious or stressed. Find good ways to deal with stress, such as exercise.  Maintain a healthy weight. Extra weight puts stress on your back.  Contact a doctor if:  You have pain that does not go away with rest or medicine.  You have worsening pain that goes down into your legs or buttocks.  You have pain that does not get better in one week.  You have pain at night.  You lose weight.  You have a fever or chills. Get help right away if:  You cannot control when you poop (bowel movement) or pee (urinate).  Your arms or  legs feel weak.  Your arms or legs lose feeling (numbness).  You feel sick to your stomach (nauseous) or throw up (vomit).  You have belly (abdominal) pain.  You feel like you may pass out (faint). This information is not intended to replace advice given to you by your health care provider. Make sure you discuss any questions you have with your health care provider. Document Released: 02/12/2008 Document Revised: 02/01/2016 Document Reviewed: 12/28/2013 Elsevier Interactive Patient Education  2018 [place] is a test to check how well your muscles and nerves are working. This procedure includes the combined use of electromyogram (EMG) and nerve conduction study (NCS). EMG is used to look for muscular disorders. NCS, which is also called electroneurogram, measures how well your nerves are controlling your muscles. The procedures are usually performed together to check if your muscles and nerves are healthy. If the reaction to testing is abnormal, this can indicate disease or injury, such as peripheral nerve damage. Tell a health care provider about:  Any allergies you have.  All medicines you are taking, including vitamins, herbs, eye drops, creams, and over-the-counter medicines.  Any problems you or family members have had with anesthetic medicines.  Any blood disorders you have.  Any surgeries you have had.  Any medical  conditions you have.  Any pacemaker you have. What are the risks? Generally, this is a safe procedure. However, problems may occur, including:  Infection where the electrodes were inserted.  Bleeding.  What happens before the procedure?  Ask your health care provider about: ? Changing or stopping your regular medicines. This is especially important if you are taking diabetes medicines or blood thinners. ? Taking medicines such as aspirin and ibuprofen. These medicines can thin your blood. Do not take these  medicines before your procedure if your health care provider instructs you not to.  Your health care provider may ask you to avoid: ? Caffeine, such as coffee and tea. ? Nicotine. This includes cigarettes and anything with tobacco.  Do not use lotions or creams on the same day that you will be having the procedure. What happens during the procedure? For EMG:  Your health care provider will ask you to stay in a position so that he or she can access the muscle that will be studied. You may be standing, sitting down, or lying down.  You may be given a medicine that numbs the area (local anesthetic).  A very thin needle that has an electrode on it will be inserted into your muscle.  Another small electrode will be placed on your skin near the muscle.  Your health care provider will ask you to continue to remain still.  The electrodes will send a signal that tells about the electrical activity of your muscles. You may see this on a monitor or hear it in the room.  After your muscles have been studied at rest, your health care provider will ask you to contract or flex your muscles. The electrodes will send a signal that tells about the electrical activity of your muscles.  Your health care provider will remove the electrodes and the electrode needles when the procedure is finished. The procedure may vary among health care providers and hospitals. For NCS:  An electrode that records your nerve activity (recording electrode) will be placed on your skin by the muscle that is being studied.  An electrode that is used as a reference (reference electrode) will be placed near the recording electrode.  A paste or gel will be applied to your skin between the recording electrode and the reference electrode.  Your nerve will be stimulated with a mild shock. Your health care provider will measure how much time it takes for your muscle to react.  Your health care provider will remove the electrodes  and the gel when the procedure is finished. The procedure may vary among health care providers and hospitals. What happens after the procedure?  It is your responsibility to obtain your test results. Ask your health care provider or the department performing the test when and how you will get your results.  Your health care provider may: ? Give you medicines for any pain. ? Monitor the insertion sites to make sure that they stop bleeding. This information is not intended to replace advice given to you by your health care provider. Make sure you discuss any questions you have with your health care provider. Document Released: 12/27/2004 Document Revised: 02/01/2016 Document Reviewed: 10/17/2014 Elsevier Interactive Patient Education  2018 [place] in the Home Falls can cause injuries. They can happen to people of all ages. There are many things you can do to make your home safe and to help prevent falls. What can I do on the outside of my home?  Regularly fix the edges of walkways and driveways and fix any cracks.  Remove anything that might make you trip as you walk through a door, such as a raised step or threshold.  Trim any bushes or trees on the path to your home.  Use bright outdoor lighting.  Clear any walking paths of anything that might make someone trip, such as rocks or tools.  Regularly check to see if handrails are loose or broken. Make sure that both sides of any steps have handrails.  Any raised decks and porches should have guardrails on the edges.  Have any leaves, snow, or ice cleared regularly.  Use sand or salt on walking paths during winter.  Clean up any spills in your garage right away. This includes oil or grease spills. What can I do in the bathroom?  Use night lights.  Install grab bars by the toilet and in the tub and shower. Do not use towel bars as grab bars.  Use non-skid mats or decals in the tub or shower.  If you need  to sit down in the shower, use a plastic, non-slip stool.  Keep the floor dry. Clean up any water that spills on the floor as soon as it happens.  Remove soap buildup in the tub or shower regularly.  Attach bath mats securely with double-sided non-slip rug tape.  Do not have throw rugs and other things on the floor that can make you trip. What can I do in the bedroom?  Use night lights.  Make sure that you have a light by your bed that is easy to reach.  Do not use any sheets or blankets that are too big for your bed. They should not hang down onto the floor.  Have a firm chair that has side arms. You can use this for support while you get dressed.  Do not have throw rugs and other things on the floor that can make you trip. What can I do in the kitchen?  Clean up any spills right away.  Avoid walking on wet floors.  Keep items that you use a lot in easy-to-reach places.  If you need to reach something above you, use a strong step stool that has a grab bar.  Keep electrical cords out of the way.  Do not use floor polish or wax that makes floors slippery. If you must use wax, use non-skid floor wax.  Do not have throw rugs and other things on the floor that can make you trip. What can I do with my stairs?  Do not leave any items on the stairs.  Make sure that there are handrails on both sides of the stairs and use them. Fix handrails that are broken or loose. Make sure that handrails are as long as the stairways.  Check any carpeting to make sure that it is firmly attached to the stairs. Fix any carpet that is loose or worn.  Avoid having throw rugs at the top or bottom of the stairs. If you do have throw rugs, attach them to the floor with carpet tape.  Make sure that you have a light switch at the top of the stairs and the bottom of the stairs. If you do not have them, ask someone to add them for you. What else can I do to help prevent falls?  Wear shoes that: ? Do  not have high heels. ? Have rubber bottoms. ? Are comfortable and fit you well. ? Are closed at  the toe. Do not wear sandals.  If you use a stepladder: ? Make sure that it is fully opened. Do not climb a closed stepladder. ? Make sure that both sides of the stepladder are locked into place. ? Ask someone to hold it for you, if possible.  Clearly mark and make sure that you can see: ? Any grab bars or handrails. ? First and last steps. ? Where the edge of each step is.  Use tools that help you move around (mobility aids) if they are needed. These include: ? Canes. ? Walkers. ? Scooters. ? Crutches.  Turn on the lights when you go into a dark area. Replace any light bulbs as soon as they burn out.  Set up your furniture so you have a clear path. Avoid moving your furniture around.  If any of your floors are uneven, fix them.  If there are any pets around you, be aware of where they are.  Review your medicines with your doctor. Some medicines can make you feel dizzy. This can increase your chance of falling. Ask your doctor what other things that you can do to help prevent falls. This information is not intended to replace advice given to you by your health care provider. Make sure you discuss any questions you have with your health care provider. Document Released: 06/22/2009 Document Revised: 02/01/2016 Document Reviewed: 09/30/2014 Elsevier Interactive Patient Education  [employer].
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
## 2018-01-08 ENCOUNTER — Encounter (INDEPENDENT_AMBULATORY_CARE_PROVIDER_SITE_OTHER): Payer: Self-pay | Admitting: Physical Medicine and Rehabilitation

## 2018-01-20 ENCOUNTER — Ambulatory Visit: Payer: Medicare Other | Admitting: Diagnostic Neuroimaging

## 2018-01-26 ENCOUNTER — Ambulatory Visit (INDEPENDENT_AMBULATORY_CARE_PROVIDER_SITE_OTHER): Payer: Medicare Other | Admitting: Physical Medicine and Rehabilitation

## 2018-01-26 ENCOUNTER — Ambulatory Visit (INDEPENDENT_AMBULATORY_CARE_PROVIDER_SITE_OTHER): Payer: Medicare Other

## 2018-01-26 ENCOUNTER — Encounter

## 2018-01-26 ENCOUNTER — Encounter (INDEPENDENT_AMBULATORY_CARE_PROVIDER_SITE_OTHER): Payer: Self-pay | Admitting: Physical Medicine and Rehabilitation

## 2018-01-26 VITALS — BP 182/99 | HR 95

## 2018-01-26 DIAGNOSIS — M5416 Radiculopathy, lumbar region: Secondary | ICD-10-CM

## 2018-01-26 MED ORDER — BETAMETHASONE SOD PHOS & ACET 6 (3-3) MG/ML IJ SUSP
12.0000 mg | Freq: Once | INTRAMUSCULAR | Status: AC
  Administered 2018-01-26: 12 mg

## 2018-01-26 NOTE — Progress Notes (Signed)
 .  Numeric Pain Rating Scale and Functional Assessment Average Pain 6   In the last MONTH (on 0-10 scale) has pain interfered with the following?  1. General activity like being  able to carry out your everyday physical activities such as walking, climbing stairs, carrying groceries, or moving a chair?  Rating(3)   +Driver, -BT, -Dye Allergies.  

## 2018-01-26 NOTE — Patient Instructions (Signed)

## 2018-02-04 ENCOUNTER — Telehealth (INDEPENDENT_AMBULATORY_CARE_PROVIDER_SITE_OTHER): Payer: Self-pay | Admitting: Physical Medicine and Rehabilitation

## 2018-02-05 ENCOUNTER — Ambulatory Visit (INDEPENDENT_AMBULATORY_CARE_PROVIDER_SITE_OTHER): Payer: Medicare Other | Admitting: Neurology

## 2018-02-05 DIAGNOSIS — M79604 Pain in right leg: Secondary | ICD-10-CM

## 2018-02-05 DIAGNOSIS — M5416 Radiculopathy, lumbar region: Secondary | ICD-10-CM | POA: Diagnosis not present

## 2018-02-05 DIAGNOSIS — Z0289 Encounter for other administrative examinations: Secondary | ICD-10-CM

## 2018-02-05 NOTE — Progress Notes (Signed)
Full Name: Danny Patrick Gender: Male MRN #: [id number] Date of Birth: 2061/07/02    Visit Date: 02/05/18 09:42 Age: 57 Years 2 Months Old Examining Physician: Sarina Ill, MD  Referring Physician: [name], NP Cc: [name], NP, Dr. Laurence Spates     History: Patient with symptoms of right L5/S1 radiculopathy.   Summary: EMG/NCS was performed on the bilateral lower extremities  The right ulnar motor nerve showed delayed distal onset latency (3.9 ms, N<3.3) and reduced amplitude (2.8 mV, N>6) and decreased amplitude (wrist to below elbow, 41 m/s, N>49) and decreased conduction velocity (below elbow to above elbow, 40 m/s, N>49) The right peroneal motor nerve showed no response The left peroneal motor nerve showed no response The right tibial motor nerve showed delayed distal onset latency (6.4 ms, N<5.8) and reduced amplitude (1 mV, N>4) and decreased conduction velocity (30 m/s, N>41) The left tibial motor nerve showed delayed distal onset latency (6.3 ms, N<5.8) and reduced amplitude (1 mV, N>4) and decreased conduction velocity (31 m/s, N>41) The right radial sensory nerve showed delayed distal peak latency (3 ms, N<2.9) and reduced amplitude (6 V, N>15) The right sural sensory nerve showed no response. The left sural sensory nerve showed no response. The right superficial peroneal sensory nerve showed no response. The left superficial peroneal sensory nerve showed no response. The right ulnar orthodromic sensory nerve showed no response.  The right tibial F wave showed delayed latency (71.8 ms, N<56) The left tibial F wave showed delayed latency (70.4 ms, N<56) The right ulnar motor nerve showed delayed latency (38.9 ms, N<56)  All remaining nerves (as detailed in the tables below) were within normal limits  The right tibialis anterior and right medial gastrocnemius showed increased spontaneous activity (3+ positive sharp waves), polyphasic motor units, prolonged  motor unit duration and reduced motor unit recruitment.  The right lumbar paraspinals showed increased spontaneous activity (3+ positive sharp waves).  All remaining muscles (as detailed in the tables below) were within normal limits.   Conclusion:   1. There is acute/ ongoing denervation and chronic neurogenic changes in muscles that are innervated by the L5 and S1 nerve roots.  There is also acute/ ongoing denervation in the right lower lumbar paraspinals. This is consistent with right L5, S1 radiculopathy.  2.  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    Sarina Ill M.D.  [facility] [place], [place] [address] Tel: [phone] Fax: [phone]        [facility]    Nerve / Sites Muscle Latency Ref. Amplitude Ref. Rel Amp Segments Distance Velocity Ref. Area    ms ms mV mV %  cm m/s m/s mVms  R Ulnar - ADM     Wrist ADM 3.9 ?3.3 2.8 ?6.0 100 Wrist - ADM 7   7.3     B.Elbow ADM 9.0  2.5  87.1 B.Elbow - Wrist 21 41 ?49 6.4     A.Elbow ADM 12.0  1.9  79.3 A.Elbow - B.Elbow 12 40 ?49 5.6         A.Elbow - Wrist      R Peroneal - EDB     Ankle EDB NR ?6.5 NR ?2.0 NR Ankle - EDB 9   NR     Fib head EDB NR  NR  NR Fib head - Ankle 33 NR ?44 NR  L Peroneal -  EDB     Ankle EDB NR ?6.5 NR ?2.0 NR Ankle - EDB 9   NR     Fib head EDB NR  NR  NR Fib head - Ankle 33 NR ?44 NR  R Tibial - AH     Ankle AH 6.4 ?5.8 1.0 ?4.0 100 Ankle - AH 9   2.2     Pop fossa AH 19.8  0.7  73.1 Pop fossa - Ankle 40 30 ?41 1.5  L Tibial - AH     Ankle AH 6.3 ?5.8 1.0 ?4.0 100 Ankle - AH 9   1.9     Pop fossa AH 19.3  0.6  55.5 Pop fossa - Ankle 40 31 ?41 1.1               SNC    Nerve / Sites Rec. Site Peak Lat Ref.  Amp Ref. Segments Distance    ms ms V V  cm  R Radial - Anatomical snuff box (Forearm)     Forearm Wrist 3.0 ?2.9 6 ?15 Forearm - Wrist 10  R Sural -  Ankle (Calf)     Calf Ankle NR ?4.4 NR ?6 Calf - Ankle 14  L Sural - Ankle (Calf)     Calf Ankle NR ?4.4 NR ?6 Calf - Ankle 14  R Superficial peroneal - Ankle     Lat leg Ankle NR ?4.4 NR ?6 Lat leg - Ankle 14  L Superficial peroneal - Ankle     Lat leg Ankle NR ?4.4 NR ?6 Lat leg - Ankle 14  R Ulnar - Orthodromic, (Dig V, Mid palm)     Dig V Wrist NR ?3.1 NR ?5 Dig V - Wrist 68                 F  Wave    Nerve F Lat Ref.   ms ms  R Tibial - AH 71.8 ?56.0  L Tibial - AH 70.4 ?56.0  R Ulnar - ADM 38.9 ?32.0           EMG full       EMG Summary Table    Spontaneous MUAP Recruitment  Muscle IA Fib PSW Fasc Other Amp Dur. Poly Pattern  R. Vastus medialis Normal None None None _______ Normal Normal Normal Normal  R. Tibialis anterior Normal None 3+ None _______ Normal Increased 2+ Reduced  R. Gastrocnemius (Medial head) Normal None 3+ None _______ Normal Increased 2+ Reduced  R. Extensor hallucis longus Normal None None None _______ Normal Normal Normal Normal  R. Biceps femoris (long head) Normal None None None _______ Normal Normal Normal Normal  R. Gluteus maximus Normal None None None _______ Normal Normal Normal Normal  R. Gluteus medius Normal None None None _______ Normal Normal Normal Normal  R. Lumbar paraspinals (low) Normal None 3+ None _______ Normal Normal Normal Normal

## 2018-02-05 NOTE — Progress Notes (Signed)
See procedure note.
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
## 2018-02-05 NOTE — Procedures (Signed)
Full Name: Danny Patrick Gender: Male MRN #: [id number] Date of Birth: 2060-09-21    Visit Date: 02/05/18 09:42 Age: 57 Years 2 Months Old Examining Physician: Sarina Ill, MD  Referring Physician: [name], NP Cc: [name], NP, Dr. Laurence Spates     History: Patient with symptoms of right L5/S1 radiculopathy.   Summary: EMG/NCS was performed on the bilateral lower extremities  The right ulnar motor nerve showed delayed distal onset latency (3.9 ms, N<3.3) and reduced amplitude (2.8 mV, N>6) and decreased amplitude (wrist to below elbow, 41 m/s, N>49) and decreased conduction velocity (below elbow to above elbow, 40 m/s, N>49) The right peroneal motor nerve showed no response The left peroneal motor nerve showed no response The right tibial motor nerve showed delayed distal onset latency (6.4 ms, N<5.8) and reduced amplitude (1 mV, N>4) and decreased conduction velocity (30 m/s, N>41) The left tibial motor nerve showed delayed distal onset latency (6.3 ms, N<5.8) and reduced amplitude (1 mV, N>4) and decreased conduction velocity (31 m/s, N>41) The right radial sensory nerve showed delayed distal peak latency (3 ms, N<2.9) and reduced amplitude (6 V, N>15) The right sural sensory nerve showed no response. The left sural sensory nerve showed no response. The right superficial peroneal sensory nerve showed no response. The left superficial peroneal sensory nerve showed no response. The right ulnar orthodromic sensory nerve showed no response.  The right tibial F wave showed delayed latency (71.8 ms, N<56) The left tibial F wave showed delayed latency (70.4 ms, N<56) The right ulnar motor nerve showed delayed latency (38.9 ms, N<56)  All remaining nerves (as detailed in the tables below) were within normal limits  The right tibialis anterior and right medial gastrocnemius showed increased spontaneous activity (3+ positive sharp waves), polyphasic motor units, prolonged  motor unit duration and reduced motor unit recruitment.  The right lumbar paraspinals showed increased spontaneous activity (3+ positive sharp waves).  All remaining muscles (as detailed in the tables below) were within normal limits.   Conclusion:   1. There is acute/ ongoing denervation and chronic neurogenic changes in muscles that are innervated by the L5 and S1 nerve roots.  There is also acute/ ongoing denervation in the right lower lumbar paraspinals. This is consistent with right L5, S1 radiculopathy.  2.  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    Sarina Ill M.D.  [facility] [place], [place] [address] Tel: [phone] Fax: [phone]        [facility]    Nerve / Sites Muscle Latency Ref. Amplitude Ref. Rel Amp Segments Distance Velocity Ref. Area    ms ms mV mV %  cm m/s m/s mVms  R Ulnar - ADM     Wrist ADM 3.9 ?3.3 2.8 ?6.0 100 Wrist - ADM 7   7.3     B.Elbow ADM 9.0  2.5  87.1 B.Elbow - Wrist 21 41 ?49 6.4     A.Elbow ADM 12.0  1.9  79.3 A.Elbow - B.Elbow 12 40 ?49 5.6         A.Elbow - Wrist      R Peroneal - EDB     Ankle EDB NR ?6.5 NR ?2.0 NR Ankle - EDB 9   NR     Fib head EDB NR  NR  NR Fib head - Ankle 33 NR ?44 NR  L Peroneal -  EDB     Ankle EDB NR ?6.5 NR ?2.0 NR Ankle - EDB 9   NR     Fib head EDB NR  NR  NR Fib head - Ankle 33 NR ?44 NR  R Tibial - AH     Ankle AH 6.4 ?5.8 1.0 ?4.0 100 Ankle - AH 9   2.2     Pop fossa AH 19.8  0.7  73.1 Pop fossa - Ankle 40 30 ?41 1.5  L Tibial - AH     Ankle AH 6.3 ?5.8 1.0 ?4.0 100 Ankle - AH 9   1.9     Pop fossa AH 19.3  0.6  55.5 Pop fossa - Ankle 40 31 ?41 1.1               SNC    Nerve / Sites Rec. Site Peak Lat Ref.  Amp Ref. Segments Distance    ms ms V V  cm  R Radial - Anatomical snuff box (Forearm)     Forearm Wrist 3.0 ?2.9 6 ?15 Forearm - Wrist 10  R Sural -  Ankle (Calf)     Calf Ankle NR ?4.4 NR ?6 Calf - Ankle 14  L Sural - Ankle (Calf)     Calf Ankle NR ?4.4 NR ?6 Calf - Ankle 14  R Superficial peroneal - Ankle     Lat leg Ankle NR ?4.4 NR ?6 Lat leg - Ankle 14  L Superficial peroneal - Ankle     Lat leg Ankle NR ?4.4 NR ?6 Lat leg - Ankle 14  R Ulnar - Orthodromic, (Dig V, Mid palm)     Dig V Wrist NR ?3.1 NR ?5 Dig V - Wrist 87                 F  Wave    Nerve F Lat Ref.   ms ms  R Tibial - AH 71.8 ?56.0  L Tibial - AH 70.4 ?56.0  R Ulnar - ADM 38.9 ?32.0           EMG full       EMG Summary Table    Spontaneous MUAP Recruitment  Muscle IA Fib PSW Fasc Other Amp Dur. Poly Pattern  R. Vastus medialis Normal None None None _______ Normal Normal Normal Normal  R. Tibialis anterior Normal None 3+ None _______ Normal Increased 2+ Reduced  R. Gastrocnemius (Medial head) Normal None 3+ None _______ Normal Increased 2+ Reduced  R. Biceps femoris (long head) Normal None None None _______ Normal Normal Normal Normal  R. Gluteus maximus Normal None None None _______ Normal Normal Normal Normal  R. Gluteus medius Normal None None None _______ Normal Normal Normal Normal  R. Lumbar paraspinals (low) Normal None 3+ None _______ Normal Normal Normal Normal

## 2018-02-06 NOTE — Procedures (Signed)
Lumbosacral Transforaminal Epidural Steroid Injection - Sub-Pedicular Approach with Fluoroscopic Guidance  [name]      Date of Birth: 10-May-1961 MRN: [id number] PCP: [name], NP      Visit Date: 01/26/2018   Universal Protocol:    Date/Time: 01/26/2018  Consent Given By: the patient  Position: PRONE  Additional Comments: Vital signs were monitored before and after the procedure. Patient was prepped and draped in the usual sterile fashion. The correct patient, procedure, and site was verified.   Injection Procedure Details:  Procedure Site One Meds Administered:  Meds ordered this encounter  Medications  . betamethasone acetate-betamethasone sodium phosphate (CELESTONE) injection 12 mg    Laterality: Right  Location/Site: MRI designates transitional segment is L5.  We correlated this with the MRI and fluoroscopic images and did complete injection at what would be numbered according the MRI of the L4 foramen. L4-L5  Needle size: 22 G  Needle type: Spinal  Needle Placement: Transforaminal  Findings:    -Comments: Excellent flow of contrast along the nerve and into the epidural space.  Procedure Details: After squaring off the end-plates to get a true AP view, the C-arm was positioned so that an oblique view of the foramen as noted above was visualized. The target area is just inferior to the "nose of the scotty dog" or sub pedicular. The soft tissues overlying this structure were infiltrated with 2-3 ml. of 1% Lidocaine without Epinephrine.  The spinal needle was inserted toward the target using a "trajectory" view along the fluoroscope beam.  Under AP and lateral visualization, the needle was advanced so it did not puncture dura and was located close the 6 O'Clock position of the pedical in AP tracterory. Biplanar projections were used to confirm position. Aspiration was confirmed to be negative for CSF and/or blood. A 1-2 ml. volume of Isovue-250  was injected and flow of contrast was noted at each level. Radiographs were obtained for documentation purposes.   After attaining the desired flow of contrast documented above, a 0.5 to 1.0 ml test dose of 0.25% Marcaine was injected into each respective transforaminal space.  The patient was observed for 90 seconds post injection.  After no sensory deficits were reported, and normal lower extremity motor function was noted,   the above injectate was administered so that equal amounts of the injectate were placed at each foramen (level) into the transforaminal epidural space.   Additional Comments:  The patient tolerated the procedure well Dressing: Band-Aid    Post-procedure details: Patient was observed during the procedure. Post-procedure instructions were reviewed.  Patient left the clinic in stable condition.
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
## 2018-02-16 NOTE — Telephone Encounter (Signed)
Closing encounter

## 2018-03-17 ENCOUNTER — Ambulatory Visit (INDEPENDENT_AMBULATORY_CARE_PROVIDER_SITE_OTHER): Payer: Medicare Other | Admitting: Podiatry

## 2018-03-17 DIAGNOSIS — M79675 Pain in left toe(s): Secondary | ICD-10-CM

## 2018-03-17 DIAGNOSIS — M79674 Pain in right toe(s): Secondary | ICD-10-CM | POA: Diagnosis not present

## 2018-03-17 DIAGNOSIS — B351 Tinea unguium: Secondary | ICD-10-CM

## 2018-03-17 DIAGNOSIS — E1165 Type 2 diabetes mellitus with hyperglycemia: Secondary | ICD-10-CM

## 2018-03-18 NOTE — Progress Notes (Signed)
Subjective: 57 y.o. returns the office today for painful, elongated, thickened toenails which he cannot trim herself. Denies any redness or drainage around the nails. Denies any acute changes since last appointment and no new complaints today. Denies any systemic complaints such as fevers, chills, nausea, vomiting.   PCP: [name], NP  Objective: AAO 3, NAD DP/PT pulses palpable, CRT less than 3 seconds Nails hypertrophic, dystrophic, elongated, brittle, discolored 10. There is tenderness overlying the nails 1-5 bilaterally. There is no surrounding erythema or drainage along the nail sites. No open lesions or pre-ulcerative lesions are identified. No other areas of tenderness bilateral lower extremities. No overlying edema, erythema, increased warmth. No pain with calf compression, swelling, warmth, erythema.  Assessment: Patient presents with symptomatic onychomycosis  Plan: -Treatment options including alternatives, risks, complications were discussed -Nails sharply debrided 10 without complication/bleeding. -Discussed daily foot inspection. If there are any changes, to call the office immediately.  -Follow-up in 3 months or sooner if any problems are to arise. In the meantime, encouraged to call the office with any questions, concerns, changes symptoms.  Celesta Gentile, DPM

## 2018-05-26 DIAGNOSIS — R011 Cardiac murmur, unspecified: Secondary | ICD-10-CM | POA: Insufficient documentation

## 2018-07-16 ENCOUNTER — Telehealth: Payer: Self-pay | Admitting: Neurology

## 2018-07-16 DIAGNOSIS — R635 Abnormal weight gain: Secondary | ICD-10-CM | POA: Insufficient documentation

## 2018-07-16 DIAGNOSIS — N189 Chronic kidney disease, unspecified: Secondary | ICD-10-CM | POA: Insufficient documentation

## 2018-07-16 NOTE — Telephone Encounter (Signed)
Please cancel patient's upcoming appointment this month. He was seen for back pain and needs epidural injections which we don;t provide. He needs to go back to Dr. Laurence Spates, discussed with patient. Please cancel appointment later this month thanks

## 2018-07-17 NOTE — Telephone Encounter (Signed)
Appt for 07/28/18 canceled.

## 2018-07-28 ENCOUNTER — Ambulatory Visit: Payer: Medicare Other | Admitting: Neurology

## 2018-10-28 ENCOUNTER — Ambulatory Visit (INDEPENDENT_AMBULATORY_CARE_PROVIDER_SITE_OTHER): Payer: Medicare Other | Admitting: Podiatry

## 2018-10-28 ENCOUNTER — Encounter: Payer: Self-pay | Admitting: Podiatry

## 2018-10-28 DIAGNOSIS — B351 Tinea unguium: Secondary | ICD-10-CM

## 2018-10-28 DIAGNOSIS — M79675 Pain in left toe(s): Secondary | ICD-10-CM

## 2018-10-28 DIAGNOSIS — E1142 Type 2 diabetes mellitus with diabetic polyneuropathy: Secondary | ICD-10-CM | POA: Diagnosis not present

## 2018-10-28 DIAGNOSIS — M79674 Pain in right toe(s): Secondary | ICD-10-CM | POA: Diagnosis not present

## 2018-10-28 NOTE — Progress Notes (Signed)
Complaint:  Visit Type: Patient returns to my office for continued preventative foot care services. Complaint: Patient states" my nails have grown long and thick and become painful to walk and wear shoes" Patient has been diagnosed with DM with no foot complications. The patient presents for preventative foot care services. No changes to ROS. Patient has not been seen in over 7 months. Patient has a history of foot infection right foot.  Podiatric Exam: Vascular: dorsalis pedis and posterior tibial pulses are palpable bilateral. Capillary return is immediate. Temperature gradient is WNL. Skin turgor WNL  Sensorium: Diminished Semmes Weinstein monofilament test. Normal tactile sensation bilaterally. Nail Exam: Pt has thick disfigured discolored nails with subungual debris noted bilateral entire nail hallux through fifth toenails Ulcer Exam: There is no evidence of ulcer or pre-ulcerative changes or infection. Orthopedic Exam: Muscle tone and strength are WNL. No limitations in general ROM. No crepitus or effusions noted. Foot type and digits show no abnormalities. Bony prominences are unremarkable. Skin: No Porokeratosis. No infection or ulcers  Diagnosis:  Onychomycosis, , Pain in right toe, pain in left toes  Treatment & Plan Procedures and Treatment: Consent by patient was obtained for treatment procedures.   Debridement of mycotic and hypertrophic toenails, 1 through 5 bilateral and clearing of subungual debris. No ulceration, no infection noted. Iatrogenic lesion 3rd left which was cauterized.   Return Visit-Office Procedure: Patient instructed to return to the office for a follow up visit 3 months for continued evaluation and treatment.    Gardiner Barefoot DPM

## 2018-11-18 ENCOUNTER — Telehealth (INDEPENDENT_AMBULATORY_CARE_PROVIDER_SITE_OTHER): Payer: Self-pay | Admitting: *Deleted

## 2018-11-18 NOTE — Telephone Encounter (Signed)
Ok if meets criteria

## 2018-11-18 NOTE — Telephone Encounter (Signed)
Called pt and he is scheduled for 12/07/2018 with driver.

## 2018-12-07 ENCOUNTER — Encounter (INDEPENDENT_AMBULATORY_CARE_PROVIDER_SITE_OTHER): Payer: Self-pay | Admitting: Physical Medicine and Rehabilitation

## 2018-12-28 ENCOUNTER — Encounter (INDEPENDENT_AMBULATORY_CARE_PROVIDER_SITE_OTHER): Payer: Medicare Other | Admitting: Physical Medicine and Rehabilitation

## 2018-12-30 ENCOUNTER — Encounter (INDEPENDENT_AMBULATORY_CARE_PROVIDER_SITE_OTHER): Payer: Medicare Other | Admitting: Physical Medicine and Rehabilitation

## 2019-01-27 ENCOUNTER — Encounter: Payer: Self-pay | Admitting: Physical Medicine and Rehabilitation

## 2019-01-27 ENCOUNTER — Encounter: Payer: Self-pay | Admitting: Podiatry

## 2019-01-27 ENCOUNTER — Ambulatory Visit (INDEPENDENT_AMBULATORY_CARE_PROVIDER_SITE_OTHER): Payer: Medicare Other | Admitting: Podiatry

## 2019-01-27 ENCOUNTER — Other Ambulatory Visit: Payer: Self-pay

## 2019-01-27 VITALS — Temp 97.5°F

## 2019-01-27 DIAGNOSIS — M79674 Pain in right toe(s): Secondary | ICD-10-CM | POA: Diagnosis not present

## 2019-01-27 DIAGNOSIS — M79675 Pain in left toe(s): Secondary | ICD-10-CM | POA: Diagnosis not present

## 2019-01-27 DIAGNOSIS — B351 Tinea unguium: Secondary | ICD-10-CM | POA: Diagnosis not present

## 2019-01-27 DIAGNOSIS — E1142 Type 2 diabetes mellitus with diabetic polyneuropathy: Secondary | ICD-10-CM

## 2019-01-27 NOTE — Progress Notes (Signed)
Complaint:  Visit Type: Patient returns to my office for continued preventative foot care services. Complaint: Patient states" my nails have grown long and thick and become painful to walk and wear shoes" Patient has been diagnosed with DM with no foot complications. The patient presents for preventative foot care services. No changes to ROS. Patient has not been seen in over 7 months. Patient has a history of foot infection right foot.  Podiatric Exam: Vascular: dorsalis pedis and posterior tibial pulses are palpable bilateral. Capillary return is immediate. Temperature gradient is WNL. Skin turgor WNL  Sensorium: Diminished Semmes Weinstein monofilament test. Normal tactile sensation bilaterally. Nail Exam: Pt has thick disfigured discolored nails with subungual debris noted bilateral entire nail hallux through fifth toenails Ulcer Exam: There is no evidence of ulcer or pre-ulcerative changes or infection. Orthopedic Exam: Muscle tone and strength are WNL. No limitations in general ROM. No crepitus or effusions noted. Foot type and digits show no abnormalities. Bony prominences are unremarkable. Skin: No Porokeratosis. No infection or ulcers  Diagnosis:  Onychomycosis, , Pain in right toe, pain in left toes  Treatment & Plan Procedures and Treatment: Consent by patient was obtained for treatment procedures.   Debridement of mycotic and hypertrophic toenails, 1 through 5 bilateral and clearing of subungual debris. No ulceration, no infection noted.    Return Visit-Office Procedure: Patient instructed to return to the office for a follow up visit 3 months for continued evaluation and treatment.    Gardiner Barefoot DPM

## 2019-02-19 DIAGNOSIS — K8050 Calculus of bile duct without cholangitis or cholecystitis without obstruction: Secondary | ICD-10-CM | POA: Insufficient documentation

## 2019-03-03 DIAGNOSIS — N179 Acute kidney failure, unspecified: Secondary | ICD-10-CM | POA: Insufficient documentation

## 2019-03-03 DIAGNOSIS — Z8619 Personal history of other infectious and parasitic diseases: Secondary | ICD-10-CM | POA: Insufficient documentation

## 2019-03-03 DIAGNOSIS — A419 Sepsis, unspecified organism: Secondary | ICD-10-CM | POA: Insufficient documentation

## 2019-04-14 DIAGNOSIS — I5032 Chronic diastolic (congestive) heart failure: Secondary | ICD-10-CM | POA: Insufficient documentation

## 2019-04-25 DIAGNOSIS — N186 End stage renal disease: Secondary | ICD-10-CM | POA: Insufficient documentation

## 2019-04-28 ENCOUNTER — Ambulatory Visit: Payer: Medicare Other | Admitting: Podiatry

## 2019-04-28 MED ORDER — HYDRALAZINE HCL 20 MG/ML IJ SOLN
10.00 | INTRAMUSCULAR | Status: DC
Start: ? — End: 2019-04-28

## 2019-04-28 MED ORDER — INSULIN GLARGINE 100 UNIT/ML ~~LOC~~ SOLN
25.00 | SUBCUTANEOUS | Status: DC
Start: 2019-04-28 — End: 2019-04-28

## 2019-04-28 MED ORDER — CLONIDINE HCL 0.1 MG PO TABS
.10 | ORAL_TABLET | ORAL | Status: DC
Start: 2019-04-28 — End: 2019-04-28

## 2019-04-28 MED ORDER — INSULIN LISPRO 100 UNIT/ML ~~LOC~~ SOLN
1.00 | SUBCUTANEOUS | Status: DC
Start: 2019-04-28 — End: 2019-04-28

## 2019-04-28 MED ORDER — FERROUS SULFATE 325 (65 FE) MG PO TABS
325.00 | ORAL_TABLET | ORAL | Status: DC
Start: 2019-04-29 — End: 2019-04-28

## 2019-04-28 MED ORDER — GENERIC EXTERNAL MEDICATION
5.00 | Status: DC
Start: ? — End: 2019-04-28

## 2019-04-28 MED ORDER — CHOLECALCIFEROL 25 MCG (1000 UT) PO TABS
5000.00 | ORAL_TABLET | ORAL | Status: DC
Start: 2019-04-29 — End: 2019-04-28

## 2019-04-28 MED ORDER — TORSEMIDE 10 MG PO TABS
40.00 | ORAL_TABLET | ORAL | Status: DC
Start: 2019-04-29 — End: 2019-04-28

## 2019-04-28 MED ORDER — ONDANSETRON HCL 4 MG/2ML IJ SOLN
4.00 | INTRAMUSCULAR | Status: DC
Start: ? — End: 2019-04-28

## 2019-04-28 MED ORDER — ATORVASTATIN CALCIUM 10 MG PO TABS
20.00 | ORAL_TABLET | ORAL | Status: DC
Start: 2019-04-29 — End: 2019-04-28

## 2019-04-28 MED ORDER — POLYETHYLENE GLYCOL 3350 17 G PO PACK
17.00 | PACK | ORAL | Status: DC
Start: 2019-04-29 — End: 2019-04-28

## 2019-04-28 MED ORDER — HEPARIN SODIUM (PORCINE) 5000 UNIT/ML IJ SOLN
5000.00 | INTRAMUSCULAR | Status: DC
Start: 2019-04-28 — End: 2019-04-28

## 2019-04-28 MED ORDER — DEXTROSE 10 % IV SOLN
125.00 | INTRAVENOUS | Status: DC
Start: ? — End: 2019-04-28

## 2019-04-28 MED ORDER — AMLODIPINE BESYLATE 5 MG PO TABS
10.00 | ORAL_TABLET | ORAL | Status: DC
Start: 2019-04-29 — End: 2019-04-28

## 2019-04-28 MED ORDER — TRAMADOL HCL 50 MG PO TABS
50.00 | ORAL_TABLET | ORAL | Status: DC
Start: ? — End: 2019-04-28

## 2019-04-28 MED ORDER — Medication
5.00 | Status: DC
Start: ? — End: 2019-04-28

## 2019-04-28 MED ORDER — BISACODYL 5 MG PO TBEC
10.00 | DELAYED_RELEASE_TABLET | ORAL | Status: DC
Start: ? — End: 2019-04-28

## 2019-04-28 MED ORDER — ALLOPURINOL 100 MG PO TABS
100.00 | ORAL_TABLET | ORAL | Status: DC
Start: 2019-04-28 — End: 2019-04-28

## 2019-04-28 MED ORDER — ACETAMINOPHEN 325 MG PO TABS
650.00 | ORAL_TABLET | ORAL | Status: DC
Start: ? — End: 2019-04-28

## 2019-04-28 MED ORDER — HYDRALAZINE HCL 50 MG PO TABS
50.00 | ORAL_TABLET | ORAL | Status: DC
Start: 2019-04-28 — End: 2019-04-28

## 2019-04-28 MED ORDER — CLONIDINE HCL 0.1 MG PO TABS
0.10 | ORAL_TABLET | ORAL | Status: DC
Start: ? — End: 2019-04-28

## 2019-04-28 MED ORDER — GLUCAGON HCL RDNA (DIAGNOSTIC) 1 MG IJ SOLR
1.00 | INTRAMUSCULAR | Status: DC
Start: ? — End: 2019-04-28

## 2019-04-28 MED ORDER — LABETALOL HCL 200 MG PO TABS
200.00 | ORAL_TABLET | ORAL | Status: DC
Start: 2019-04-28 — End: 2019-04-28

## 2019-04-28 MED ORDER — SEVELAMER CARBONATE 800 MG PO TABS
800.00 | ORAL_TABLET | ORAL | Status: DC
Start: 2019-04-28 — End: 2019-04-28

## 2019-04-28 MED ORDER — ASPIRIN EC 81 MG PO TBEC
81.00 | DELAYED_RELEASE_TABLET | ORAL | Status: DC
Start: 2019-04-29 — End: 2019-04-28

## 2019-04-28 MED ORDER — GLUCOSE 40 % PO GEL
15.00 | ORAL | Status: DC
Start: ? — End: 2019-04-28

## 2019-04-28 MED ORDER — POLYETHYLENE GLYCOL 3350 17 G PO PACK
17.00 | PACK | ORAL | Status: DC
Start: ? — End: 2019-04-28

## 2019-04-28 MED ORDER — ONDANSETRON 4 MG PO TBDP
4.00 | ORAL_TABLET | ORAL | Status: DC
Start: ? — End: 2019-04-28

## 2019-04-28 MED ORDER — FAMOTIDINE 20 MG PO TABS
20.00 | ORAL_TABLET | ORAL | Status: DC
Start: 2019-04-28 — End: 2019-04-28

## 2019-04-29 DIAGNOSIS — N186 End stage renal disease: Secondary | ICD-10-CM | POA: Insufficient documentation

## 2019-04-29 DIAGNOSIS — D689 Coagulation defect, unspecified: Secondary | ICD-10-CM | POA: Insufficient documentation

## 2019-04-29 DIAGNOSIS — D509 Iron deficiency anemia, unspecified: Secondary | ICD-10-CM | POA: Insufficient documentation

## 2019-04-29 DIAGNOSIS — L299 Pruritus, unspecified: Secondary | ICD-10-CM | POA: Insufficient documentation

## 2019-04-29 DIAGNOSIS — Z4901 Encounter for fitting and adjustment of extracorporeal dialysis catheter: Secondary | ICD-10-CM | POA: Insufficient documentation

## 2019-04-29 DIAGNOSIS — N2581 Secondary hyperparathyroidism of renal origin: Secondary | ICD-10-CM | POA: Insufficient documentation

## 2019-04-29 DIAGNOSIS — E785 Hyperlipidemia, unspecified: Secondary | ICD-10-CM | POA: Insufficient documentation

## 2019-04-29 DIAGNOSIS — D631 Anemia in chronic kidney disease: Secondary | ICD-10-CM | POA: Insufficient documentation

## 2019-04-29 DIAGNOSIS — R0602 Shortness of breath: Secondary | ICD-10-CM | POA: Insufficient documentation

## 2019-04-29 DIAGNOSIS — N189 Chronic kidney disease, unspecified: Secondary | ICD-10-CM | POA: Insufficient documentation

## 2019-06-09 DIAGNOSIS — E875 Hyperkalemia: Secondary | ICD-10-CM | POA: Insufficient documentation

## 2019-07-06 ENCOUNTER — Telehealth: Payer: Self-pay | Admitting: Vascular Surgery

## 2019-07-27 ENCOUNTER — Other Ambulatory Visit: Payer: Self-pay

## 2019-07-27 DIAGNOSIS — Z992 Dependence on renal dialysis: Secondary | ICD-10-CM

## 2019-07-27 DIAGNOSIS — N186 End stage renal disease: Secondary | ICD-10-CM

## 2019-07-29 ENCOUNTER — Ambulatory Visit (INDEPENDENT_AMBULATORY_CARE_PROVIDER_SITE_OTHER): Payer: Medicare Other | Admitting: Vascular Surgery

## 2019-07-29 ENCOUNTER — Ambulatory Visit (INDEPENDENT_AMBULATORY_CARE_PROVIDER_SITE_OTHER)
Admission: RE | Admit: 2019-07-29 | Discharge: 2019-07-29 | Disposition: A | Discharge status: Home / Self Care | Payer: Medicare Other | Source: Ambulatory Visit | Attending: Family | Admitting: Family

## 2019-07-29 ENCOUNTER — Ambulatory Visit (HOSPITAL_COMMUNITY)
Admission: RE | Admit: 2019-07-29 | Discharge: 2019-07-29 | Disposition: A | Discharge status: Home / Self Care | Payer: Medicare Other | Source: Ambulatory Visit | Attending: Family | Admitting: Family

## 2019-07-29 ENCOUNTER — Other Ambulatory Visit: Payer: Self-pay

## 2019-07-29 ENCOUNTER — Encounter: Payer: Self-pay | Admitting: Vascular Surgery

## 2019-07-29 VITALS — BP 176/86 | HR 71 | Temp 97.9°F | Resp 20 | Ht 69.0 in | Wt 254.3 lb

## 2019-07-29 DIAGNOSIS — Z992 Dependence on renal dialysis: Secondary | ICD-10-CM | POA: Insufficient documentation

## 2019-07-29 DIAGNOSIS — N186 End stage renal disease: Secondary | ICD-10-CM | POA: Diagnosis present

## 2019-07-29 NOTE — Progress Notes (Signed)
Referring Physician: Dr. Justin Mend  Patient name: Danny Patrick. MRN: [id number] DOB: 14-Nov-1960 Sex: male  REASON FOR CONSULT: Hemodialysis access  HPI: Danny Patrick. is a 58 y.o. male who has been on hemodialysis for about 3 months via catheter.  He is right-handed.  He has never had any other access procedures.  His current dialysis today is Monday Wednesday Friday.  Other medical problems include diabetes hypertension arthritis.  All of these are currently stable.  Past Medical History:  Diagnosis Date  . Arthritis   . Chronic kidney disease   . Diabetes mellitus ([place])   . Hypertension    Past Surgical History:  Procedure Laterality Date  . KNEE ARTHROSCOPY     Bilateral   . NOSE SURGERY      Family History  Problem Relation Age of Onset  . Diabetes type II Mother   . Multiple sclerosis Sister   . Diabetes Brother   . Coronary artery disease Brother   . Colon cancer Neg Hx     SOCIAL HISTORY: Social History   Socioeconomic History  . Marital status: Divorced    Spouse name: Not on file  . Number of children: 2  . Years of education: Not on file  . Highest education level: Some college, no degree  Occupational History  . Occupation: Unemployed   Social Needs  . Financial resource strain: Not on file  . Food insecurity    Worry: Not on file    Inability: Not on file  . Transportation needs    Medical: Not on file    Non-medical: Not on file  Tobacco Use  . Smoking status: Never Smoker  . Smokeless tobacco: Never Used  Substance and Sexual Activity  . Alcohol use: No  . Drug use: No  . Sexual activity: Not on file  Lifestyle  . Physical activity    Days per week: Not on file    Minutes per session: Not on file  . Stress: Not on file  Relationships  . Social Herbalist on phone: Not on file    Gets together: Not on file    Attends religious service: Not on file    Active member of club or organization: Not on file    Attends meetings of  clubs or organizations: Not on file    Relationship status: Not on file  . Intimate partner violence    Fear of current or ex partner: Not on file    Emotionally abused: Not on file    Physically abused: Not on file    Forced sexual activity: Not on file  Other Topics Concern  . Not on file  Social History Narrative   Caffeine: occasionally 1 cup    Lives at home alone   Right handed    Allergies  Allergen Reactions  . Penicillins Swelling    Pain  Pain  Pain   . Vancomycin Other (See Comments)    Acute renal insuff. When in hospital 2015. Acute renal insuff. When in hospital 2015.   Marland Kitchen Chocolate Flavor Other (See Comments)    Migraines  . Chocolate Other (See Comments)    migraines  . Cocoa Other (See Comments)    migraines  . Insulin Aspart Swelling  . Lisinopril Cough    Current Outpatient Medications  Medication Sig Dispense Refill  . albuterol (PROVENTIL HFA;VENTOLIN HFA) 108 (90 Base) MCG/ACT inhaler Inhale into the lungs.    Marland Kitchen allopurinol (ZYLOPRIM) 100  MG tablet Take by mouth.    Marland Kitchen amLODipine (NORVASC) 10 MG tablet Take by mouth.    Marland Kitchen aspirin EC 81 MG tablet Take by mouth.    Marland Kitchen atorvastatin (LIPITOR) 10 MG tablet Take by mouth.    Marland Kitchen b complex-vitamin c-folic acid (NEPHRO-VITE) 0.8 MG TABS tablet Take 1 tablet by mouth daily.    . BD PEN NEEDLE NANO U/F 32G X 4 MM MISC FOR INSULIN INJECTIONS QID    . Cholecalciferol (VITAMIN D-1000 MAX ST) 25 MCG (1000 UT) tablet Take by mouth.    . cloNIDine (CATAPRES) 0.1 MG tablet Take by mouth.    . cyclobenzaprine (FLEXERIL) 5 MG tablet Take 5 mg by mouth 3 (three) times daily as needed for muscle spasms.    Marland Kitchen docusate sodium (COLACE) 100 MG capsule Take by mouth.    . famotidine (PEPCID) 20 MG tablet     . ferrous sulfate 325 (65 FE) MG tablet Take by mouth.    . hydrALAZINE (APRESOLINE) 50 MG tablet Take by mouth.    . hydrochlorothiazide (HYDRODIURIL) 25 MG tablet Take 25 mg by mouth daily.    . insulin aspart  (NOVOLOG) 100 UNIT/ML injection Inject 10 Units into the skin 3 (three) times daily before meals.    . Insulin Glargine (LANTUS SOLOSTAR) 100 UNIT/ML Solostar Pen Inject 35 Units into the skin daily.     . insulin lispro (HUMALOG) 100 UNIT/ML injection Inject into the skin.    . Insulin Syringe-Needle U-100 (INSULIN SYRINGE .3CC/29GX1/2") 29G X 1/2" 0.3 ML MISC 1 each by Misc.(Non-Drug; Combo Route) route daily as needed.    . labetalol (NORMODYNE) 100 MG tablet Take by mouth.    . losartan (COZAAR) 100 MG tablet TK 1 T PO ONCE A DAY    . metolazone (ZAROXOLYN) 2.5 MG tablet Take by mouth.    . polyethylene glycol (MIRALAX / GLYCOLAX) packet Take by mouth.    . sevelamer carbonate (RENVELA) 800 MG tablet Take by mouth.    . torsemide (DEMADEX) 20 MG tablet Take by mouth.    . TRESIBA FLEXTOUCH 200 UNIT/ML SOPN INJECT 60 UNITS INTO THE SKIN D    . UNABLE TO FIND Inject into the muscle.    Marland Kitchen UNABLE TO FIND 1 each by Misc.(Non-Drug; Combo Route) route 4 (four) times daily as needed.    . VENTOLIN HFA 108 (90 Base) MCG/ACT inhaler INL 2 PFS ITL Q 6 H PRF WHZ     No current facility-administered medications for this visit.     ROS:   General:  No weight loss, Fever, chills  HEENT: No recent headaches, no nasal bleeding, no visual changes, no sore throat  Neurologic: No dizziness, blackouts, seizures. No recent symptoms of stroke or mini- stroke. No recent episodes of slurred speech, or temporary blindness.  Cardiac: No recent episodes of chest pain/pressure, no shortness of breath at rest.  No shortness of breath with exertion.  Denies history of atrial fibrillation or irregular heartbeat  Vascular: No history of rest pain in feet.  No history of claudication.  No history of non-healing ulcer, No history of DVT   Pulmonary: No home oxygen, no productive cough, no hemoptysis,  No asthma or wheezing  Musculoskeletal:  [X]  Arthritis, [ ]  Low back pain,  [X]  Joint pain  Hematologic:No  history of hypercoagulable state.  No history of easy bleeding.  No history of anemia  Gastrointestinal: No hematochezia or melena,  No gastroesophageal reflux, no trouble swallowing  Urinary: [X]  chronic Kidney disease, [X]  on HD - [X]  MWF or [ ]  TTHS, [ ]  Burning with urination, [ ]  Frequent urination, [ ]  Difficulty urinating;   Skin: No rashes  Psychological: No history of anxiety,  No history of depression   Physical Examination  Vitals:   07/29/19 1612  BP: (!) 176/86  Pulse: 71  Resp: 20  Temp: 97.9 F (36.6 C)  Weight: 254 lb 4.8 oz (115.3 kg)  Height: 5\' 9"  (1.753 m)    Body mass index is 37.55 kg/m.  General:  Alert and oriented, no acute distress HEENT: Normal Neck: No JVD Pulmonary: Clear to auscultation bilaterally Cardiac: Regular Rate and Rhythm  Abdomen: Soft, non-tender, non-distended, no mass Skin: No rash Extremity Pulses:  2+ radial, brachial pulses bilaterally Musculoskeletal: No deformity or edema  Neurologic: Upper and lower extremity motor 5/5 and symmetric  DATA:  He had a vein mapping ultrasound today which shows cephalic vein is about 3 mm in diameter from the wrist to the upper arm bilaterally.  Basilic vein was about 4 mm.  He had normal arterial anatomy bilaterally.  ASSESSMENT: He needs long-term hemodialysis access.  His anatomy is suitable for a left radiocephalic AV fistula.  Risk benefits possible complications and procedure details include but not limited to bleeding infection periincisional numbness nonmaturation of the fistula were discussed today.  He understands and agrees to proceed.  This is scheduled for August 10, 2019.    PLAN: Left radiocephalic AV fistula August 10, 2019   Ruta Hinds, MD [facility] Office: [phone] Pager: [phone]

## 2019-07-30 ENCOUNTER — Other Ambulatory Visit: Payer: Self-pay | Admitting: *Deleted

## 2019-07-30 ENCOUNTER — Telehealth: Payer: Self-pay | Admitting: *Deleted

## 2019-07-30 ENCOUNTER — Encounter: Payer: Self-pay | Admitting: *Deleted

## 2019-07-30 NOTE — Telephone Encounter (Signed)
Pre-op instruction/letter received at [facility]".

## 2019-08-07 ENCOUNTER — Other Ambulatory Visit (HOSPITAL_COMMUNITY)
Admission: RE | Admit: 2019-08-07 | Discharge: 2019-08-07 | Disposition: A | Discharge status: Home / Self Care | Payer: Medicare Other | Source: Ambulatory Visit | Attending: Vascular Surgery | Admitting: Vascular Surgery

## 2019-08-07 DIAGNOSIS — U071 COVID-19: Secondary | ICD-10-CM | POA: Diagnosis not present

## 2019-08-07 DIAGNOSIS — Z01812 Encounter for preprocedural laboratory examination: Secondary | ICD-10-CM | POA: Diagnosis present

## 2019-08-08 LAB — NOVEL CORONAVIRUS, NAA (HOSP ORDER, SEND-OUT TO REF LAB; TAT 18-24 HRS): SARS-CoV-2, NAA: DETECTED — AB

## 2019-08-09 ENCOUNTER — Telehealth: Payer: Self-pay | Admitting: *Deleted

## 2019-08-09 ENCOUNTER — Telehealth: Payer: Self-pay | Admitting: Nurse Practitioner

## 2019-08-09 NOTE — Progress Notes (Signed)
Left message with Becky RN at [facility] for patients positive COVID test result

## 2019-08-09 NOTE — Telephone Encounter (Signed)
Called to Discuss with patient about Covid symptoms and the use of bamlanivimab, a monoclonal antibody infusion for those with mild to moderate Covid symptoms and at a high risk of hospitalization.     Pt is qualified for this infusion at the [facility] infusion center due to co-morbid conditions and/or a member of an at-risk group.     Unable to reach pt - no available voicemail - will try back later

## 2019-08-09 NOTE — Telephone Encounter (Signed)
Unable to get patient on the phone. No answer voice mail full. Spoke with [place] to notify patient when he is at HD today that he is COvid positive, notify PCP and that 08/10/2019 surgery will be cancelled and re-scheduled in a month. Spoke with Aldona Bar that patient will be going to [facility] on TTS second shift.

## 2019-08-09 NOTE — Telephone Encounter (Signed)
Spoke with patient on the phone. Informed that surgery with Dr. Oneida Alar will be rescheduled for 21-30 days. This office will call. Instructed patient to call [facility] for HD schedule and notify PCP of positive Covid-19 test. Verbalized understanding.

## 2019-08-10 ENCOUNTER — Ambulatory Visit (HOSPITAL_COMMUNITY): Admission: RE | Admit: 2019-08-10 | Payer: Medicare Other | Source: Home / Self Care | Admitting: Vascular Surgery

## 2019-08-10 SURGERY — ARTERIOVENOUS (AV) FISTULA CREATION
Anesthesia: Choice | Laterality: Left

## 2019-08-25 ENCOUNTER — Telehealth: Payer: Self-pay | Admitting: *Deleted

## 2019-08-25 ENCOUNTER — Encounter: Payer: Self-pay | Admitting: *Deleted

## 2019-08-25 NOTE — Progress Notes (Signed)
Scheduled for surgery. Positive Covid 08/07/2019 > 30 days. Patient states he has been asymptomatic and is tested 2x per week at [facility]. Instructed to be at [facility] admitting at 8 am or as directed by the hospital on 09/13/2019. NPO past MN night prior and must have a driver and caregiver for discharge to home. Expect a call and follow detailed surgey instructions received for hospital pre-admission department. Verbalized understanding an will fax all instructions to [facility] for patient.

## 2019-08-25 NOTE — Telephone Encounter (Signed)
Danny Patrick at [facility] confirmed fax received for patient's pre-op instructions.

## 2019-09-07 ENCOUNTER — Ambulatory Visit (INDEPENDENT_AMBULATORY_CARE_PROVIDER_SITE_OTHER): Payer: Medicare Other | Admitting: Podiatry

## 2019-09-07 ENCOUNTER — Telehealth: Payer: Self-pay | Admitting: *Deleted

## 2019-09-07 ENCOUNTER — Encounter (HOSPITAL_COMMUNITY): Payer: Self-pay | Admitting: Vascular Surgery

## 2019-09-07 ENCOUNTER — Encounter: Payer: Self-pay | Admitting: Podiatry

## 2019-09-07 ENCOUNTER — Other Ambulatory Visit: Payer: Self-pay

## 2019-09-07 DIAGNOSIS — M79674 Pain in right toe(s): Secondary | ICD-10-CM | POA: Diagnosis not present

## 2019-09-07 DIAGNOSIS — M79675 Pain in left toe(s): Secondary | ICD-10-CM | POA: Diagnosis not present

## 2019-09-07 DIAGNOSIS — B351 Tinea unguium: Secondary | ICD-10-CM

## 2019-09-07 DIAGNOSIS — E1142 Type 2 diabetes mellitus with diabetic polyneuropathy: Secondary | ICD-10-CM

## 2019-09-07 NOTE — Progress Notes (Signed)
Complaint:  Visit Type: Patient returns to my office for continued preventative foot care services. Complaint: Patient states" my nails have grown long and thick and become painful to walk and wear shoes" Patient has been diagnosed with DM with no foot complications. The patient presents for preventative foot care services. No changes to ROS. Patient has not been seen in over 7 months.   Podiatric Exam: Vascular: dorsalis pedis and posterior tibial pulses are palpable bilateral. Capillary return is immediate. Temperature gradient is WNL. Skin turgor WNL  Sensorium: Diminished Semmes Weinstein monofilament test. Normal tactile sensation bilaterally. Nail Exam: Pt has thick disfigured discolored nails with subungual debris noted bilateral entire nail hallux through fifth toenails Ulcer Exam: There is no evidence of ulcer or pre-ulcerative changes or infection. Orthopedic Exam: Muscle tone and strength are WNL. No limitations in general ROM. No crepitus or effusions noted. Foot type and digits show no abnormalities. Bony prominences are unremarkable. Skin: No Porokeratosis. No infection or ulcers  Diagnosis:  Onychomycosis, , Pain in right toe, pain in left toes  Treatment & Plan Procedures and Treatment: Consent by patient was obtained for treatment procedures.   Debridement of mycotic and hypertrophic toenails, 1 through 5 bilateral and clearing of subungual debris. No ulceration, no infection noted.    Return Visit-Office Procedure: Patient instructed to return to the office for a follow up visit 3 months for continued evaluation and treatment.    Gardiner Barefoot DPM

## 2019-09-07 NOTE — Telephone Encounter (Signed)
Spoke with Danielle at [facility]. Will change HD treatment days to accommodate surgery on Monday 09/13/2019. She will notify patient of times.

## 2019-09-08 ENCOUNTER — Encounter (HOSPITAL_COMMUNITY): Payer: Self-pay | Admitting: Vascular Surgery

## 2019-09-08 ENCOUNTER — Other Ambulatory Visit: Payer: Self-pay

## 2019-09-08 NOTE — Anesthesia Preprocedure Evaluation (Addendum)
Anesthesia Evaluation  Patient identified by MRN, date of birth, ID band Patient awake    Reviewed: Allergy & Precautions, H&P , NPO status , Patient's Chart, lab work & pertinent test results  History of Anesthesia Complications (+) PONV and history of anesthetic complications  Airway Mallampati: II   Neck ROM: full    Dental   Pulmonary neg pulmonary ROS,    breath sounds clear to auscultation       Cardiovascular hypertension, +CHF   Rhythm:regular Rate:Normal     Neuro/Psych  Neuromuscular disease    GI/Hepatic   Endo/Other  diabetes, Type 2obese  Renal/GU ESRF and DialysisRenal disease     Musculoskeletal   Abdominal   Peds  Hematology  (+) Blood dyscrasia, anemia ,   Anesthesia Other Findings   Reproductive/Obstetrics                            Anesthesia Physical Anesthesia Plan  ASA: III  Anesthesia Plan: General   Post-op Pain Management:    Induction: Intravenous  PONV Risk Score and Plan: 3 and Ondansetron, Dexamethasone, Midazolam and Treatment may vary due to age or medical condition  Airway Management Planned: LMA  Additional Equipment:   Intra-op Plan:   Post-operative Plan:   Informed Consent: I have reviewed the patients History and Physical, chart, labs and discussed the procedure including the risks, benefits and alternatives for the proposed anesthesia with the patient or authorized representative who has indicated his/her understanding and acceptance.       Plan Discussed with: CRNA, Anesthesiologist and Surgeon  Anesthesia Plan Comments: (PAT note written by Myra Gianotti, PA-C. SAME DAY WORK-UP   )       Anesthesia Quick Evaluation

## 2019-09-08 NOTE — Progress Notes (Signed)
Anesthesia Chart Review: Danny Patrick   Case: [id number] Date/Time: 09/13/19 1034   Procedure: CREATION OF RADIOCEPHALIC ARTERIOVENOUS FISTULA LEFT ARM (Left )   Anesthesia type: Choice   Pre-op diagnosis: END STAGE RENAL DISEASE FOR HEMODIALYSIS ACCESS   Location: [facility] OR ROOM 21 / [facility]   Surgeons: Elam Dutch, MD      DISCUSSION: Patient is a 58 year old male scheduled for the above procedure. Surgery was initially scheduled for 08/10/19, but was postponed for at least 21 days due to positive COVID-19 test on 08/07/19.   History includes never smoker, post-operative N/V, HTN, DM2, ESRD (HD MWF, started 04/22/19 via right IJ Permcath), chronic diastolic CHF (normal diastolic function 99991111 echo; did have multiple admission for volume overload 2019-2020 prior to starting hemodialysis), HLD, anemia, pyomyositis left shoulder (s/p I&D, excision of skin/subcutaneous tissue and muscle 02/20/19), cholecystectomy (02/26/19, with ERCP 02/27/19).  He denied SOB, cough, fever, chest pain per PAT RN phone interview. He reported fasting home CBGs if 120-190's.  He is now > 1 month out from COVID-19 diagnosis. Notes suggest DM has been better controlled now that he in on hemodialysis and on insulin. Unsure of last A1c result. He is on clonidine, labetalol and losartan for HTN.  He tolerated two surgeries six months ago and currently undergoing hemodialysis 3x/week via tunneled catheter. He needs permanent dialysis access. He is a same day work-up, so further evaluation by his anesthesia team on the day of surgery. August 2020 EKG requested from [facility], but if not received then will likely need updated EKG on the day of surgery.    VS: Per VVS office visit:   07/29/19 1612  BP: (!) 176/86  Pulse: 71  Resp: 20  Temp: 97.9 F (36.6 C)  Weight: 254 lb 4.8 oz (115.3 kg)  Height: 5\' 9"  (1.753 m)     PROVIDERS: [name], NP is PCP ([place]) - Abran Richard, MD is  cardiologist with [facility] (see Care Everywhere). Initial visit 05/26/18 for evaluation of murmur and LE edema. Renal dysfunction felt to be a contributing factor of edema. Echo showed normal LVEF, moderate to severe LVH with no outflow gradient noted, and no significant valvular abnormalities. Last visit 08/25/28 with Daivd Council, NP. He had had several hospitalizations for fluid overload (in the setting of worsening renal function), but otherwise no specific CV symptom concerns. She did discuss what sounds like a screening ETT given his family history of CAD, but it does not look like this was ever done. He did go on to have additional admissions for volume overload, as well as left shoulder pyomyositis admission in June and required surgery for that followed by cholecystectomy a few days later.  He ultimately started hemodialysis in August 2020. He did have a cardiology telemedicine visit on 08/26/19, but the provider was unable to connect with patient (no answer).  Edrick Oh, MD is nephrologist. He has been referred to [facility] for kidney transplant evaluation, but appears this was delayed due to recent COVID-19 diagnosis last month.    LABS: He is for updated labs on arrival.    IMAGES: 1V PCXR 04/19/19 [facility] CE, insetting of acute on chronic renal failure, pre-initiation of hemodialysis): IMPRESSION: 1. Suspect worsening CHF with increase in bilateral pleural effusions and mild pulmonary edema.   EKG: EKG 04/15/19 [facility]): Per Result Narrative in Care Everywhere: Sinus rhythm Nonspecific ST and T wave abnormality When compared with ECG of  22-Feb-2019 17:40, QRS duration has decreased Confirmed by Mathis Bud [phone]) on 04/15/2019 8:02:11 AM    CV: Echo 06/02/18 [facility] CE): Summary  Normal left ventricular size and systolic function with no appreciable  segmental abnormality.  Ejection fraction is visually estimated at 55-60%. Diastolic function appears  normal.   Moderate to Severe concentric left ventricular hypertrophy. No outflow  gradient demonstrated  No significant valvular abnormalities.   Past Medical History:  Diagnosis Date  . Anemia   . Arthritis   . CHF (congestive heart failure) (HCC)    chronic diastolic  . Diabetes mellitus ([place])    Type II  . ESRD (end stage renal disease) ([place])    Hemodialysis MWF, [facility]  . Hyperlipidemia    no meds  . Hypertension   . PONV (postoperative nausea and vomiting)     Past Surgical History:  Procedure Laterality Date  . COLONOSCOPY     polyp  . EYE SURGERY Right     r/t diabetes  . KNEE ARTHROSCOPY     Bilateral   . KNEE ARTHROSCOPY     x 2 - Bilateral  . NOSE SURGERY    . NOSE SURGERY      MEDICATIONS: No current facility-administered medications for this encounter.   Marland Kitchen albuterol (PROVENTIL HFA;VENTOLIN HFA) 108 (90 Base) MCG/ACT inhaler  . aspirin EC 81 MG tablet  . atorvastatin (LIPITOR) 10 MG tablet  . b complex-vitamin c-folic acid (NEPHRO-VITE) 0.8 MG TABS tablet  . Cholecalciferol (VITAMIN D-1000 MAX ST) 25 MCG (1000 UT) tablet  . cloNIDine (CATAPRES) 0.1 MG tablet  . docusate sodium (COLACE) 100 MG capsule  . ferrous sulfate 325 (65 FE) MG tablet  . Insulin Glargine (LANTUS SOLOSTAR) 100 UNIT/ML Solostar Pen  . labetalol (NORMODYNE) 100 MG tablet  . losartan (COZAAR) 100 MG tablet  . polyethylene glycol (MIRALAX / GLYCOLAX) packet  . sevelamer carbonate (RENVELA) 800 MG tablet  . BD PEN NEEDLE NANO U/F 32G X 4 MM MISC  . Insulin Syringe-Needle U-100 (INSULIN SYRINGE .3CC/29GX1/2") 29G X 1/2" 0.3 ML MISC  . UNABLE TO FIND  . UNABLE TO FIND    Myra Gianotti, PA-C Surgical Short Stay/Anesthesiology [facility] Phone [phone] [facility] Phone [phone] 09/08/2019 4:52 PM

## 2019-09-08 NOTE — Progress Notes (Signed)
  [facility] [place] [address], [address] AT [address] [place] [address] Phone: [phone] Fax: [phone]      Your procedure is scheduled on Monday, 09/13/19  Report to [facility] Main Entrance "A" at 8:15 am A.M., and check in at the Admitting office.  Call this number if you have problems the morning of surgery:  [phone]  Call [phone] if you have any questions prior to your surgery date Monday-Friday 8am-4pm    Remember:  Do not eat or drink after midnight the night before your surgery   Take these medicines the morning of surgery with A SIP OF WATER:  Aspirin, atorvastatin, clonidine and labetalol.  Diabetes:  Take 50% of your dinner/bedtime dose of Lantus if needed per your sliding scale Sunday night.  On day of surgery, Monday, take 50% of your morning Lantus dose if needed per your sliding scale.  STOP taking any Aleve, Naproxen, Ibuprofen, Motrin, Advil, Goody's, BC's, all herbal medications, fish oil, and all vitamins.  Tylenol is ok to take if needed.    The Morning of Surgery  Do not wear jewelry.  Do not wear lotions, powders, colognes, or deodorant   Men may shave face and neck.  Do not bring valuables to the hospital.  [facility] is not responsible for any belongings or valuables.  Remember that you must have someone to transport you home after your surgery, and remain with you for 24 hours if you are discharged the same day.  Patients discharged the day of surgery will not be allowed to drive home.   Please bring cases for contacts, glasses, hearing aids, dentures or bridgework because it cannot be worn into surgery.    Oral Hygiene is important to reduce your risk of infection.  Remember - BRUSH YOUR TEETH THE MORNING OF SURGERY WITH YOUR REGULAR TOOTHPASTE   Day of Surgery:  Do not apply any deodorants/lotions. Please wear clean clothes to the hospital/surgery center.    Remember to brush your teeth WITH YOUR REGULAR TOOTHPASTE.

## 2019-09-08 NOTE — Progress Notes (Signed)
Patient denies shortness of breath, fever, cough and chest pain.  PCP - Randie Heinz, FNP Cardiologist - Estevan Ryder, PA-C  Chest x-ray - 04/19/19 [facility] EKG - 04/14/19 [facility] Stress Test - denies ECHO - denies Cardiac Cath - denies  Fasting Blood Sugar - 120-190s Checks Blood Sugar __3___ times a day .  Marland Kitchen THE NIGHT BEFORE SURGERY, take 50% Glargine insulin     . THE MORNING OF SURGERY, take 50 % Glargine insulin  . If your blood sugar is less than 70 mg/dL, you will need to treat for low blood sugar: o Treat a low blood sugar (less than 70 mg/dL) with  cup of clear juice (cranberry or apple). o Recheck blood sugar in 15 minutes after treatment (to make sure it is greater than 70 mg/dL). If your blood sugar is not greater than 70 mg/dL on recheck, call [phone] for further instructions.  Anesthesia review:  Yes  STOP now taking Aleve, Naproxen, Ibuprofen, Motrin, Advil, Goody's, BC's, all herbal medications, fish oil, and all vitamins.   Coronavirus Screening  Patient had positive covid test on 08/07/19.  Now asymptomatic. Have you experienced the following symptoms:  Cough yes/no: No Fever (>100.52F)  yes/no: No Runny nose yes/no: No Sore throat yes/no: No Difficulty breathing/shortness of breath  yes/no: No  Have you traveled in the last 14 days and where? yes/no: No  Patient verbalized understanding of instructions that were given via phone.

## 2019-09-13 ENCOUNTER — Ambulatory Visit (HOSPITAL_COMMUNITY): Payer: Medicare Other | Admitting: Vascular Surgery

## 2019-09-13 ENCOUNTER — Encounter (HOSPITAL_COMMUNITY): Payer: Self-pay | Admitting: Vascular Surgery

## 2019-09-13 ENCOUNTER — Ambulatory Visit (HOSPITAL_COMMUNITY)
Admission: RE | Admit: 2019-09-13 | Discharge: 2019-09-13 | Disposition: A | Discharge status: Home / Self Care | Payer: Medicare Other | Attending: Vascular Surgery | Admitting: Vascular Surgery

## 2019-09-13 ENCOUNTER — Encounter (HOSPITAL_COMMUNITY)
Admission: RE | Disposition: A | Discharge status: Home / Self Care | Payer: Self-pay | Source: Home / Self Care | Attending: Vascular Surgery

## 2019-09-13 ENCOUNTER — Other Ambulatory Visit: Payer: Self-pay

## 2019-09-13 DIAGNOSIS — Z880 Allergy status to penicillin: Secondary | ICD-10-CM | POA: Diagnosis not present

## 2019-09-13 DIAGNOSIS — Z881 Allergy status to other antibiotic agents status: Secondary | ICD-10-CM | POA: Diagnosis not present

## 2019-09-13 DIAGNOSIS — Z992 Dependence on renal dialysis: Secondary | ICD-10-CM | POA: Diagnosis not present

## 2019-09-13 DIAGNOSIS — N186 End stage renal disease: Secondary | ICD-10-CM | POA: Insufficient documentation

## 2019-09-13 DIAGNOSIS — M1990 Unspecified osteoarthritis, unspecified site: Secondary | ICD-10-CM | POA: Diagnosis not present

## 2019-09-13 DIAGNOSIS — I120 Hypertensive chronic kidney disease with stage 5 chronic kidney disease or end stage renal disease: Secondary | ICD-10-CM | POA: Insufficient documentation

## 2019-09-13 DIAGNOSIS — E1122 Type 2 diabetes mellitus with diabetic chronic kidney disease: Secondary | ICD-10-CM | POA: Diagnosis not present

## 2019-09-13 DIAGNOSIS — Z794 Long term (current) use of insulin: Secondary | ICD-10-CM | POA: Insufficient documentation

## 2019-09-13 DIAGNOSIS — Z7982 Long term (current) use of aspirin: Secondary | ICD-10-CM | POA: Diagnosis not present

## 2019-09-13 DIAGNOSIS — Z79899 Other long term (current) drug therapy: Secondary | ICD-10-CM | POA: Insufficient documentation

## 2019-09-13 DIAGNOSIS — Z888 Allergy status to other drugs, medicaments and biological substances status: Secondary | ICD-10-CM | POA: Diagnosis not present

## 2019-09-13 DIAGNOSIS — N185 Chronic kidney disease, stage 5: Secondary | ICD-10-CM

## 2019-09-13 HISTORY — PX: AV FISTULA PLACEMENT: SHX1204

## 2019-09-13 HISTORY — DX: Other specified postprocedural states: Z98.890

## 2019-09-13 HISTORY — DX: Hyperlipidemia, unspecified: E78.5

## 2019-09-13 HISTORY — DX: Heart failure, unspecified: I50.9

## 2019-09-13 HISTORY — DX: End stage renal disease: N18.6

## 2019-09-13 HISTORY — DX: Anemia, unspecified: D64.9

## 2019-09-13 HISTORY — DX: Other specified postprocedural states: R11.2

## 2019-09-13 LAB — POCT I-STAT, CHEM 8
BUN: 43 mg/dL — ABNORMAL HIGH (ref 6–20)
Calcium, Ion: 0.97 mmol/L — ABNORMAL LOW (ref 1.15–1.40)
Chloride: 103 mmol/L (ref 98–111)
Creatinine, Ser: 6.4 mg/dL — ABNORMAL HIGH (ref 0.61–1.24)
Glucose, Bld: 153 mg/dL — ABNORMAL HIGH (ref 70–99)
HCT: 37 % — ABNORMAL LOW (ref 39.0–52.0)
Hemoglobin: 12.6 g/dL — ABNORMAL LOW (ref 13.0–17.0)
Potassium: 3.7 mmol/L (ref 3.5–5.1)
Sodium: 138 mmol/L (ref 135–145)
TCO2: 25 mmol/L (ref 22–32)

## 2019-09-13 LAB — GLUCOSE, CAPILLARY
Glucose-Capillary: 152 mg/dL — ABNORMAL HIGH (ref 70–99)
Glucose-Capillary: 152 mg/dL — ABNORMAL HIGH (ref 70–99)
Glucose-Capillary: 152 mg/dL — ABNORMAL HIGH (ref 70–99)
Glucose-Capillary: 148 mg/dL — ABNORMAL HIGH (ref 70–99)

## 2019-09-13 SURGERY — ARTERIOVENOUS (AV) FISTULA CREATION
Anesthesia: General | Laterality: Left

## 2019-09-13 MED ORDER — PROPOFOL 10 MG/ML IV BOLUS
INTRAVENOUS | Status: DC | PRN
  Administered 2019-09-13: 160 mg via INTRAVENOUS

## 2019-09-13 MED ORDER — EPHEDRINE 5 MG/ML INJ
INTRAVENOUS | Status: AC
  Filled 2019-09-13: qty 10

## 2019-09-13 MED ORDER — DEXAMETHASONE SODIUM PHOSPHATE 10 MG/ML IJ SOLN
INTRAMUSCULAR | Status: AC
  Filled 2019-09-13: qty 1

## 2019-09-13 MED ORDER — OXYCODONE HCL 5 MG PO TABS
ORAL_TABLET | ORAL | Status: AC
  Filled 2019-09-13: qty 1

## 2019-09-13 MED ORDER — DEXAMETHASONE SODIUM PHOSPHATE 10 MG/ML IJ SOLN
INTRAMUSCULAR | Status: DC | PRN
  Administered 2019-09-13: 4 mg via INTRAVENOUS

## 2019-09-13 MED ORDER — FENTANYL CITRATE (PF) 100 MCG/2ML IJ SOLN: 25.0000 ug | INTRAMUSCULAR | Status: DC | PRN

## 2019-09-13 MED ORDER — CHLORHEXIDINE GLUCONATE 4 % EX LIQD: 60.0000 mL | Freq: Once | CUTANEOUS | Status: DC

## 2019-09-13 MED ORDER — MIDAZOLAM HCL 5 MG/5ML IJ SOLN
INTRAMUSCULAR | Status: DC | PRN
  Administered 2019-09-13 (×2): 1 mg via INTRAVENOUS

## 2019-09-13 MED ORDER — EPHEDRINE SULFATE-NACL 50-0.9 MG/10ML-% IV SOSY
PREFILLED_SYRINGE | INTRAVENOUS | Status: DC | PRN
  Administered 2019-09-13: 10 mg via INTRAVENOUS

## 2019-09-13 MED ORDER — FENTANYL CITRATE (PF) 250 MCG/5ML IJ SOLN
INTRAMUSCULAR | Status: AC
  Filled 2019-09-13: qty 5

## 2019-09-13 MED ORDER — OXYCODONE HCL 5 MG PO TABS
5.0000 mg | ORAL_TABLET | Freq: Once | ORAL | Status: AC | PRN
  Administered 2019-09-13: 5 mg via ORAL

## 2019-09-13 MED ORDER — SODIUM CHLORIDE 0.9 % IV SOLN
INTRAVENOUS | Status: AC
  Filled 2019-09-13: qty 1.2

## 2019-09-13 MED ORDER — HYDROCODONE-ACETAMINOPHEN 5-325 MG PO TABS: 1.0000 | ORAL_TABLET | Freq: Four times a day (QID) | ORAL | 0 refills | Status: AC | PRN

## 2019-09-13 MED ORDER — ONDANSETRON HCL 4 MG/2ML IJ SOLN
INTRAMUSCULAR | Status: DC | PRN
  Administered 2019-09-13: 4 mg via INTRAVENOUS

## 2019-09-13 MED ORDER — OXYCODONE HCL 5 MG/5ML PO SOLN: 5.0000 mg | Freq: Once | ORAL | Status: AC | PRN

## 2019-09-13 MED ORDER — PROPOFOL 10 MG/ML IV BOLUS
INTRAVENOUS | Status: AC
  Filled 2019-09-13: qty 20

## 2019-09-13 MED ORDER — PROTAMINE SULFATE 10 MG/ML IV SOLN
INTRAVENOUS | Status: AC
  Filled 2019-09-13: qty 10

## 2019-09-13 MED ORDER — LIDOCAINE 2% (20 MG/ML) 5 ML SYRINGE
INTRAMUSCULAR | Status: AC
  Filled 2019-09-13: qty 5

## 2019-09-13 MED ORDER — MIDAZOLAM HCL 2 MG/2ML IJ SOLN
INTRAMUSCULAR | Status: AC
  Filled 2019-09-13: qty 2

## 2019-09-13 MED ORDER — SODIUM CHLORIDE 0.9 % IV SOLN
INTRAVENOUS | Status: DC | PRN
  Administered 2019-09-13: 500 mL

## 2019-09-13 MED ORDER — HEPARIN SODIUM (PORCINE) 1000 UNIT/ML IJ SOLN
INTRAMUSCULAR | Status: DC | PRN
  Administered 2019-09-13: 5000 [IU] via INTRAVENOUS

## 2019-09-13 MED ORDER — ONDANSETRON HCL 4 MG/2ML IJ SOLN
INTRAMUSCULAR | Status: AC
  Filled 2019-09-13: qty 2

## 2019-09-13 MED ORDER — FENTANYL CITRATE (PF) 100 MCG/2ML IJ SOLN
INTRAMUSCULAR | Status: DC | PRN
  Administered 2019-09-13 (×3): 50 ug via INTRAVENOUS

## 2019-09-13 MED ORDER — LIDOCAINE 2% (20 MG/ML) 5 ML SYRINGE
INTRAMUSCULAR | Status: DC | PRN
  Administered 2019-09-13: 60 mg via INTRAVENOUS

## 2019-09-13 MED ORDER — ONDANSETRON HCL 4 MG/2ML IJ SOLN: 4.0000 mg | Freq: Four times a day (QID) | INTRAMUSCULAR | Status: DC | PRN

## 2019-09-13 MED ORDER — CIPROFLOXACIN IN D5W 400 MG/200ML IV SOLN
400.0000 mg | INTRAVENOUS | Status: AC
  Administered 2019-09-13: 14:00:00 400 mg via INTRAVENOUS
  Filled 2019-09-13: qty 200

## 2019-09-13 MED ORDER — SODIUM CHLORIDE 0.9 % IV SOLN: INTRAVENOUS | Status: DC

## 2019-09-13 SURGICAL SUPPLY — 33 items
ARMBAND PINK RESTRICT EXTREMIT (MISCELLANEOUS) ×2 IMPLANT
CANISTER SUCT 3000ML PPV (MISCELLANEOUS) ×2 IMPLANT
CANNULA VESSEL 3MM 2 BLNT TIP (CANNULA) ×2 IMPLANT
CLIP VESOCCLUDE MED 6/CT (CLIP) ×2 IMPLANT
CLIP VESOCCLUDE SM WIDE 6/CT (CLIP) ×2 IMPLANT
COVER PROBE W GEL 5X96 (DRAPES) ×2 IMPLANT
DECANTER SPIKE VIAL GLASS SM (MISCELLANEOUS) ×2 IMPLANT
DERMABOND ADVANCED (GAUZE/BANDAGES/DRESSINGS) ×1
DERMABOND ADVANCED .7 DNX12 (GAUZE/BANDAGES/DRESSINGS) ×1 IMPLANT
DRAIN PENROSE 1/4X12 LTX STRL (WOUND CARE) ×2 IMPLANT
ELECT REM PT RETURN 9FT ADLT (ELECTROSURGICAL) ×2
ELECTRODE REM PT RTRN 9FT ADLT (ELECTROSURGICAL) ×1 IMPLANT
GLOVE BIO SURGEON STRL SZ 6.5 (GLOVE) ×2 IMPLANT
GLOVE BIO SURGEON STRL SZ7.5 (GLOVE) ×2 IMPLANT
GOWN STRL REUS W/ TWL LRG LVL3 (GOWN DISPOSABLE) ×3 IMPLANT
GOWN STRL REUS W/TWL LRG LVL3 (GOWN DISPOSABLE) ×3
KIT BASIN OR (CUSTOM PROCEDURE TRAY) ×2 IMPLANT
KIT TURNOVER KIT B (KITS) ×2 IMPLANT
LOOP VESSEL MINI RED (MISCELLANEOUS) IMPLANT
NS IRRIG 1000ML POUR BTL (IV SOLUTION) ×2 IMPLANT
PACK CV ACCESS (CUSTOM PROCEDURE TRAY) ×2 IMPLANT
PAD ARMBOARD 7.5X6 YLW CONV (MISCELLANEOUS) ×4 IMPLANT
SPONGE SURGIFOAM ABS GEL 100 (HEMOSTASIS) IMPLANT
SUT PROLENE 6 0 CC (SUTURE) ×2 IMPLANT
SUT PROLENE 7 0 BV 1 (SUTURE) ×2 IMPLANT
SUT SILK 3 0 (SUTURE) ×1
SUT SILK 3-0 18XBRD TIE 12 (SUTURE) ×1 IMPLANT
SUT VIC AB 3-0 SH 27 (SUTURE) ×1
SUT VIC AB 3-0 SH 27X BRD (SUTURE) ×1 IMPLANT
SUT VICRYL 4-0 PS2 18IN ABS (SUTURE) ×2 IMPLANT
TOWEL GREEN STERILE (TOWEL DISPOSABLE) ×2 IMPLANT
UNDERPAD 30X30 (UNDERPADS AND DIAPERS) ×2 IMPLANT
WATER STERILE IRR 1000ML POUR (IV SOLUTION) ×2 IMPLANT

## 2019-09-13 NOTE — Anesthesia Procedure Notes (Signed)
Procedure Name: LMA Insertion Date/Time: 09/13/2019 1:56 PM Performed by: Moshe Salisbury, CRNA Pre-anesthesia Checklist: Patient identified, Emergency Drugs available, Suction available and Patient being monitored Patient Re-evaluated:Patient Re-evaluated prior to induction Oxygen Delivery Method: Circle System Utilized Preoxygenation: Pre-oxygenation with 100% oxygen Induction Type: IV induction Ventilation: Mask ventilation without difficulty LMA: LMA inserted LMA Size: 5.0 Number of attempts: 1 Placement Confirmation: positive ETCO2 Tube secured with: Tape Dental Injury: Teeth and Oropharynx as per pre-operative assessment

## 2019-09-13 NOTE — Discharge Instructions (Signed)
° °  [facility] ° °Discharge Instructions ° °AV Fistula or Graft Surgery for Dialysis Access ° °Please refer to the following instructions for your post-procedure care. Your surgeon or physician assistant will discuss any changes with you. ° °Activity ° °You may drive the day following your surgery, if you are comfortable and no longer taking prescription pain medication. Resume full activity as the soreness in your incision resolves. ° °Bathing/Showering ° °You may shower after you go home. Keep your incision dry for 48 hours. Do not soak in a bathtub, hot tub, or swim until the incision heals completely. You may not shower if you have a hemodialysis catheter. ° °Incision Care ° °Clean your incision with mild soap and water after 48 hours. Pat the area dry with a clean towel. You do not need a bandage unless otherwise instructed. Do not apply any ointments or creams to your incision. You may have skin glue on your incision. Do not peel it off. It will come off on its own in about one week. Your arm may swell a bit after surgery. To reduce swelling use pillows to elevate your arm so it is above your heart. Your doctor will tell you if you need to lightly wrap your arm with an ACE bandage. ° °Diet ° °Resume your normal diet. There are not special food restrictions following this procedure. In order to heal from your surgery, it is CRITICAL to get adequate nutrition. Your body requires vitamins, minerals, and protein. Vegetables are the best source of vitamins and minerals. Vegetables also provide the perfect balance of protein. Processed food has little nutritional value, so try to avoid this. ° °Medications ° °Resume taking all of your medications. If your incision is causing pain, you may take over-the counter pain relievers such as acetaminophen (Tylenol). If you were prescribed a stronger pain medication, please be aware these medications can cause nausea and constipation. Prevent  nausea by taking the medication with a snack or meal. Avoid constipation by drinking plenty of fluids and eating foods with high amount of fiber, such as fruits, vegetables, and grains. Do not take Tylenol if you are taking prescription pain medications. ° ° ° ° °Follow up °Your surgeon may want to see you in the office following your access surgery. If so, this will be arranged at the time of your surgery. ° °Please call us immediately for any of the following conditions: ° °Increased pain, redness, drainage (pus) from your incision site °Fever of 101 degrees or higher °Severe or worsening pain at your incision site °Hand pain or numbness. ° °Reduce your risk of vascular disease: ° °Stop smoking. If you would like help, call [facility] at 1-800-QUIT-NOW (1-800-784-8669) or [place] at 336-586-4000 ° °Manage your cholesterol °Maintain a desired weight °Control your diabetes °Keep your blood pressure down ° °Dialysis ° °It will take several weeks to several months for your new dialysis access to be ready for use. Your surgeon will determine when it is OK to use it. Your nephrologist will continue to direct your dialysis. You can continue to use your Permcath until your new access is ready for use. ° °If you have any questions, please call the office at [phone]. ° °

## 2019-09-13 NOTE — H&P (Signed)
Referring Physician: Dr. Justin Mend  Patient name: Danny Patrick. MRN: [id number] DOB: 24-Jul-1961 Sex: male  REASON FOR CONSULT: Hemodialysis access   HPI:  Danny Zubia. is a 59 y.o. male who has been on hemodialysis for about 3 months via catheter. He is right-handed. He has never had any other access procedures. His current dialysis today is Monday Wednesday Friday. Other medical problems include diabetes hypertension arthritis. All of these are currently stable.       Past Medical History:  Diagnosis Date  . Arthritis   . Chronic kidney disease   . Diabetes mellitus ([place])   . Hypertension         Past Surgical History:  Procedure Laterality Date  . KNEE ARTHROSCOPY     Bilateral   . NOSE SURGERY          Family History  Problem Relation Age of Onset  . Diabetes type II Mother   . Multiple sclerosis Sister   . Diabetes Brother   . Coronary artery disease Brother   . Colon cancer Neg Hx    SOCIAL HISTORY:  Social History        Socioeconomic History  . Marital status: Divorced    Spouse name: Not on file  . Number of children: 2  . Years of education: Not on file  . Highest education level: Some college, no degree  Occupational History  . Occupation: Unemployed   Social Needs  . Financial resource strain: Not on file  . Food insecurity    Worry: Not on file    Inability: Not on file  . Transportation needs    Medical: Not on file    Non-medical: Not on file  Tobacco Use  . Smoking status: Never Smoker  . Smokeless tobacco: Never Used  Substance and Sexual Activity  . Alcohol use: No  . Drug use: No  . Sexual activity: Not on file  Lifestyle  . Physical activity    Days per week: Not on file    Minutes per session: Not on file  . Stress: Not on file  Relationships  . Social Herbalist on phone: Not on file    Gets together: Not on file    Attends religious service: Not on file    Active member of club or organization: Not on file    Attends  meetings of clubs or organizations: Not on file    Relationship status: Not on file  . Intimate partner violence    Fear of current or ex partner: Not on file    Emotionally abused: Not on file    Physically abused: Not on file    Forced sexual activity: Not on file  Other Topics Concern  . Not on file  Social History Narrative   Caffeine: occasionally 1 cup    Lives at home alone   Right handed        Allergies  Allergen Reactions  . Penicillins Swelling    Pain  Pain  Pain   . Vancomycin Other (See Comments)    Acute renal insuff. When in hospital 2015.  Acute renal insuff. When in hospital 2015.   Marland Kitchen Chocolate Flavor Other (See Comments)    Migraines  . Chocolate Other (See Comments)    migraines  . Cocoa Other (See Comments)    migraines  . Insulin Aspart Swelling  . Lisinopril Cough         Current Outpatient Medications  Medication Sig  Dispense Refill  . albuterol (PROVENTIL HFA;VENTOLIN HFA) 108 (90 Base) MCG/ACT inhaler Inhale into the lungs.    Marland Kitchen allopurinol (ZYLOPRIM) 100 MG tablet Take by mouth.    Marland Kitchen amLODipine (NORVASC) 10 MG tablet Take by mouth.    Marland Kitchen aspirin EC 81 MG tablet Take by mouth.    Marland Kitchen atorvastatin (LIPITOR) 10 MG tablet Take by mouth.    Marland Kitchen b complex-vitamin c-folic acid (NEPHRO-VITE) 0.8 MG TABS tablet Take 1 tablet by mouth daily.    . BD PEN NEEDLE NANO U/F 32G X 4 MM MISC FOR INSULIN INJECTIONS QID    . Cholecalciferol (VITAMIN D-1000 MAX ST) 25 MCG (1000 UT) tablet Take by mouth.    . cloNIDine (CATAPRES) 0.1 MG tablet Take by mouth.    . cyclobenzaprine (FLEXERIL) 5 MG tablet Take 5 mg by mouth 3 (three) times daily as needed for muscle spasms.    Marland Kitchen docusate sodium (COLACE) 100 MG capsule Take by mouth.    . famotidine (PEPCID) 20 MG tablet     . ferrous sulfate 325 (65 FE) MG tablet Take by mouth.    . hydrALAZINE (APRESOLINE) 50 MG tablet Take by mouth.    . hydrochlorothiazide (HYDRODIURIL) 25 MG tablet Take 25 mg by mouth daily.    .  insulin aspart (NOVOLOG) 100 UNIT/ML injection Inject 10 Units into the skin 3 (three) times daily before meals.    . Insulin Glargine (LANTUS SOLOSTAR) 100 UNIT/ML Solostar Pen Inject 35 Units into the skin daily.     . insulin lispro (HUMALOG) 100 UNIT/ML injection Inject into the skin.    . Insulin Syringe-Needle U-100 (INSULIN SYRINGE .3CC/29GX1/2") 29G X 1/2" 0.3 ML MISC 1 each by Misc.(Non-Drug; Combo Route) route daily as needed.    . labetalol (NORMODYNE) 100 MG tablet Take by mouth.    . losartan (COZAAR) 100 MG tablet TK 1 T PO ONCE A DAY    . metolazone (ZAROXOLYN) 2.5 MG tablet Take by mouth.    . polyethylene glycol (MIRALAX / GLYCOLAX) packet Take by mouth.    . sevelamer carbonate (RENVELA) 800 MG tablet Take by mouth.    . torsemide (DEMADEX) 20 MG tablet Take by mouth.    . TRESIBA FLEXTOUCH 200 UNIT/ML SOPN INJECT 60 UNITS INTO THE SKIN D    . UNABLE TO FIND Inject into the muscle.    Marland Kitchen UNABLE TO FIND 1 each by Misc.(Non-Drug; Combo Route) route 4 (four) times daily as needed.    . VENTOLIN HFA 108 (90 Base) MCG/ACT inhaler INL 2 PFS ITL Q 6 H PRF WHZ     No current facility-administered medications for this visit.    ROS:  General: No weight loss, Fever, chills  HEENT: No recent headaches, no nasal bleeding, no visual changes, no sore throat  Neurologic: No dizziness, blackouts, seizures. No recent symptoms of stroke or mini- stroke. No recent episodes of slurred speech, or temporary blindness.  Cardiac: No recent episodes of chest pain/pressure, no shortness of breath at rest. No shortness of breath with exertion. Denies history of atrial fibrillation or irregular heartbeat  Vascular: No history of rest pain in feet. No history of claudication. No history of non-healing ulcer, No history of DVT  Pulmonary: No home oxygen, no productive cough, no hemoptysis, No asthma or wheezing  Musculoskeletal: [X]  Arthritis, [ ]  Low back pain, [X]  Joint pain  Hematologic:No history of  hypercoagulable state. No history of easy bleeding. No history of anemia  Gastrointestinal: No  hematochezia or melena, No gastroesophageal reflux, no trouble swallowing  Urinary: [X]  chronic Kidney disease, [X]  on HD - [X]  MWF or [ ]  TTHS, [ ]  Burning with urination, [ ]  Frequent urination, [ ]  Difficulty urinating;  Skin: No rashes  Psychological: No history of anxiety, No history of depression  Physical Examination   Vitals:   09/08/19 1019 09/13/19 0819 09/13/19 0832  BP:  (!) 221/97 (!) 198/81  Pulse:  85   Resp:  20   Temp:  98.2 F (36.8 C)   TempSrc:  Oral   SpO2:  98%   Weight: 106.5 kg 106.6 kg   Height: 5\' 9"  (1.753 m) 5\' 9"  (1.753 m)    Body mass index is 37.55 kg/m.  General: Alert and oriented, no acute distress  HEENT: Normal  Neck: No JVD  Pulmonary: Clear to auscultation bilaterally  Cardiac: Regular Rate and Rhythm  Abdomen: Soft, non-tender, non-distended, no mass  Skin: No rash  Extremity Pulses: 2+ radial, brachial pulses bilaterally  Musculoskeletal: No deformity or edema  Neurologic: Upper and lower extremity motor 5/5 and symmetric  DATA:  He had a vein mapping ultrasound today which shows cephalic vein is about 3 mm in diameter from the wrist to the upper arm bilaterally. Basilic vein was about 4 mm. He had normal arterial anatomy bilaterally.   ASSESSMENT: He needs long-term hemodialysis access. His anatomy is suitable for a left radiocephalic AV fistula. Risk benefits possible complications and procedure details include but not limited to bleeding infection periincisional numbness nonmaturation of the fistula were discussed today. He understands and agrees to proceed. This is scheduled for 09/13/19 .  PLAN: Left radiocephalic AV fistula   Ruta Hinds, MD  [facility]  Office: [phone]  Pager: [phone]

## 2019-09-13 NOTE — Op Note (Signed)
Procedure: Left Radial Cephalic AV fistula   Preop: ESRD   Postop: ESRD   Anesthesia: General  Assistant:  Risa Grill, PA-c   Findings: 3 mm cephalic vein       2.5 mm radial artery   Procedure Details:  After commencing general anesthesia, the left upper extremity was prepped and draped in usual sterile fashion. A penrose was used to dilate the vein and it was then marked.  A longitudinal skin incision was then made in this location at the distal left forearm. The incision was carried into the subcutaneous tissues down to level cephalic vein. The vein had some spasm but was overall reasonable quality accepting a 3.5 mm dilator. The vein was dissected free circumferentially and small side branches ligated and divided between silk ties. The distal end was ligated. This was gently distended with heparinized saline, spatulated, and marked for orientation. Next the radial artery was dissected free in the medial portion incision. The artery was 2.5 mm in diameter but had a reasonable pulse. The vessel loops were placed proximal and distal to the planned site of arteriotomy. The patient was given 5000 units of intravenous heparin. After appropriate circulation time, the vessel loops were used to control the artery. A longitudinal opening was made in the left radial artery. The vein was controlled proximally with a fine bulldog clamp. The vein was then swung over to the artery and sewn end of vein to side of artery using a running 7-0 Prolene suture. Just prior to completion, the anastomosis was fore bled back bled and thoroughly flushed. The anastomosis was secured, vessel loops released, and there was a palpable thrill in the fistula immediately. After hemostasis was obtained, the subcutaneous tissues were reapproximated using a running 3-0 Vicryl suture. The skin was then closed with a 4 Vicryl subcuticular stitch. Dermabond was applied to the skin incision. The patient tolerated the procedure well and  there were no complications. Instrument sponge and needle count were correct at the end of the case. The patient was taken to PACU in stable condition.   Ruta Hinds, MD  [facility]  Office: [phone]  Pager: [phone]

## 2019-09-13 NOTE — Anesthesia Postprocedure Evaluation (Signed)
Anesthesia Post Note  [name]  Procedure(s) Performed: CREATION OF RADIOCEPHALIC ARTERIOVENOUS FISTULA LEFT ARM (Left )     Patient location during evaluation: PACU Anesthesia Type: General Level of consciousness: patient cooperative, oriented and sedated Pain management: pain level controlled Vital Signs Assessment: post-procedure vital signs reviewed and stable Respiratory status: spontaneous breathing, nonlabored ventilation and respiratory function stable Cardiovascular status: blood pressure returned to baseline and stable Postop Assessment: no apparent nausea or vomiting Anesthetic complications: no    Last Vitals:  Vitals:   09/13/19 1606 09/13/19 1621  BP: (!) 160/82 (!) 168/84  Pulse: 72 73  Resp: 15 15  Temp:  36.4 C  SpO2: 98% 99%    Last Pain:  Vitals:   09/13/19 1621  TempSrc:   PainSc: Asleep                 Jari Dipasquale,E. Dameka Younker

## 2019-09-13 NOTE — Transfer of Care (Signed)
Immediate Anesthesia Transfer of Care Note  [name]  Procedure(s) Performed: CREATION OF RADIOCEPHALIC ARTERIOVENOUS FISTULA LEFT ARM (Left )  Patient Location: PACU  Anesthesia Type:General  Level of Consciousness: drowsy and patient cooperative  Airway & Oxygen Therapy: Patient Spontanous Breathing and Patient connected to nasal cannula oxygen  Post-op Assessment: Report given to RN and Post -op Vital signs reviewed and stable  Post vital signs: Reviewed and stable  Last Vitals:  Vitals Value Taken Time  BP    Temp    Pulse    Resp    SpO2      Last Pain:  Vitals:   09/13/19 0830  TempSrc:   PainSc: 0-No pain      Patients Stated Pain Goal: 3 ([id number])  Complications: No apparent anesthesia complications

## 2019-10-28 ENCOUNTER — Ambulatory Visit (HOSPITAL_COMMUNITY): Payer: Medicare Other

## 2019-11-03 ENCOUNTER — Other Ambulatory Visit: Payer: Self-pay

## 2019-11-03 ENCOUNTER — Telehealth (HOSPITAL_COMMUNITY): Payer: Self-pay

## 2019-11-03 DIAGNOSIS — Z992 Dependence on renal dialysis: Secondary | ICD-10-CM

## 2019-11-03 DIAGNOSIS — N186 End stage renal disease: Secondary | ICD-10-CM

## 2019-11-03 NOTE — Telephone Encounter (Signed)

## 2019-11-04 ENCOUNTER — Ambulatory Visit (INDEPENDENT_AMBULATORY_CARE_PROVIDER_SITE_OTHER): Payer: Self-pay | Admitting: Vascular Surgery

## 2019-11-04 ENCOUNTER — Other Ambulatory Visit: Payer: Self-pay

## 2019-11-04 ENCOUNTER — Encounter: Payer: Self-pay | Admitting: Vascular Surgery

## 2019-11-04 ENCOUNTER — Ambulatory Visit (HOSPITAL_COMMUNITY)
Admission: RE | Admit: 2019-11-04 | Discharge: 2019-11-04 | Disposition: A | Discharge status: Home / Self Care | Payer: Medicare Other | Source: Ambulatory Visit | Attending: Vascular Surgery | Admitting: Vascular Surgery

## 2019-11-04 VITALS — BP 150/67 | HR 80 | Temp 97.9°F | Resp 18 | Ht 69.0 in | Wt 230.0 lb

## 2019-11-04 DIAGNOSIS — N186 End stage renal disease: Secondary | ICD-10-CM

## 2019-11-04 DIAGNOSIS — Z992 Dependence on renal dialysis: Secondary | ICD-10-CM

## 2019-11-04 NOTE — Progress Notes (Signed)
Patient name: Danny Patrick. MRN: [id number] DOB: 1961-02-27 Sex: male  REASON FOR VISIT:   Follow-up after left radiocephalic AV fistula  HPI:   Danny Patrick. is a pleasant 59 y.o. male who had a left radiocephalic AV fistula placed by Dr. Ruta Hinds on 09/13/2019.  He comes in for 6-week follow-up visit.  He denies pain or paresthesias in his left hand.  He has a functioning right IJ tunneled dialysis catheter and dialyzes on Monday Wednesdays and Fridays.  He said no recent uremic symptoms.  Current Outpatient Medications  Medication Sig Dispense Refill  . albuterol (PROVENTIL HFA;VENTOLIN HFA) 108 (90 Base) MCG/ACT inhaler Inhale 1-2 puffs into the lungs every 4 (four) hours as needed for wheezing or shortness of breath.     Marland Kitchen aspirin EC 81 MG tablet Take 81 mg by mouth daily.     Marland Kitchen atorvastatin (LIPITOR) 10 MG tablet Take 10 mg by mouth daily.     Marland Kitchen b complex-vitamin c-folic acid (NEPHRO-VITE) 0.8 MG TABS tablet Take 1 tablet by mouth daily.    . BD PEN NEEDLE NANO U/F 32G X 4 MM MISC FOR INSULIN INJECTIONS QID    . Cholecalciferol (VITAMIN D-1000 MAX ST) 25 MCG (1000 UT) tablet Take 1,000 Units by mouth daily.     . cloNIDine (CATAPRES) 0.1 MG tablet Take 0.1 mg by mouth 2 (two) times daily.     Marland Kitchen docusate sodium (COLACE) 100 MG capsule Take 100 mg by mouth daily as needed for moderate constipation.     . ferrous sulfate 325 (65 FE) MG tablet Take 325 mg by mouth daily with breakfast.     . Insulin Glargine (LANTUS SOLOSTAR) 100 UNIT/ML Solostar Pen Inject 1-12 Units into the skin daily as needed (if blood sugar gets above 180).     . Insulin Syringe-Needle U-100 (INSULIN SYRINGE .3CC/29GX1/2") 29G X 1/2" 0.3 ML MISC 1 each by Misc.(Non-Drug; Combo Route) route daily as needed.    . labetalol (NORMODYNE) 100 MG tablet Take 100 mg by mouth 4 (four) times daily.     Marland Kitchen losartan (COZAAR) 100 MG tablet Take 100 mg by mouth daily.     . polyethylene glycol (MIRALAX / GLYCOLAX) packet  Take 17 g by mouth daily as needed for moderate constipation.     . sevelamer carbonate (RENVELA) 800 MG tablet Take 800 mg by mouth 3 (three) times daily with meals.     Marland Kitchen UNABLE TO FIND Inject into the muscle.    Marland Kitchen UNABLE TO FIND 1 each by Misc.(Non-Drug; Combo Route) route 4 (four) times daily as needed.     No current facility-administered medications for this visit.    REVIEW OF SYSTEMS:  [X]  denotes positive finding, [ ]  denotes negative finding Vascular    Leg swelling    Cardiac    Chest pain or chest pressure:    Shortness of breath upon exertion:    Short of breath when lying flat:    Irregular heart rhythm:    Constitutional    Fever or chills:     PHYSICAL EXAM:   Vitals:   11/04/19 0913  BP: (!) 150/67  Pulse: 80  Resp: 18  Temp: 97.9 F (36.6 C)  TempSrc: Temporal  SpO2: 98%  Weight: 230 lb (104.3 kg)  Height: 5\' 9"  (1.753 m)    GENERAL: The patient is a well-nourished male, in no acute distress. The vital signs are documented above. CARDIOVASCULAR: There is a regular rate and  rhythm. PULMONARY: There is good air exchange bilaterally without wheezing or rales. His fistula has an excellent thrill.  He has a palpable radial pulse.  DATA:   DIALYSIS ACCESS DUPLEX: His fistula is maturing nicely with diameters ranging from 0.45-0.53 cm.  In one competing branch.  No areas of stenosis are identified.  MEDICAL ISSUES:   STATUS POST LEFT RADIOCEPHALIC AV FISTULA: His fistula appears to be maturing adequately.  I think this should be ready for access in early April.  He knows to call if he has any problems.  Deitra Mayo [facility] [phone]

## 2019-12-07 ENCOUNTER — Other Ambulatory Visit: Payer: Self-pay

## 2019-12-07 ENCOUNTER — Ambulatory Visit (INDEPENDENT_AMBULATORY_CARE_PROVIDER_SITE_OTHER): Payer: Medicare Other | Admitting: Podiatry

## 2019-12-07 ENCOUNTER — Encounter: Payer: Self-pay | Admitting: Podiatry

## 2019-12-07 VITALS — Temp 97.6°F

## 2019-12-07 DIAGNOSIS — E1142 Type 2 diabetes mellitus with diabetic polyneuropathy: Secondary | ICD-10-CM | POA: Diagnosis not present

## 2019-12-07 DIAGNOSIS — E1169 Type 2 diabetes mellitus with other specified complication: Secondary | ICD-10-CM

## 2019-12-07 DIAGNOSIS — M79675 Pain in left toe(s): Secondary | ICD-10-CM | POA: Diagnosis not present

## 2019-12-07 DIAGNOSIS — B351 Tinea unguium: Secondary | ICD-10-CM

## 2019-12-07 DIAGNOSIS — M79674 Pain in right toe(s): Secondary | ICD-10-CM

## 2019-12-07 DIAGNOSIS — Z794 Long term (current) use of insulin: Secondary | ICD-10-CM
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
## 2020-02-15 ENCOUNTER — Ambulatory Visit: Payer: Medicare Other | Admitting: Podiatry

## 2020-04-24 DIAGNOSIS — T7840XS Allergy, unspecified, sequela: Secondary | ICD-10-CM | POA: Insufficient documentation

## 2020-05-02 ENCOUNTER — Other Ambulatory Visit: Payer: Self-pay

## 2020-05-02 ENCOUNTER — Encounter: Payer: Self-pay | Admitting: Podiatry

## 2020-05-02 ENCOUNTER — Ambulatory Visit (INDEPENDENT_AMBULATORY_CARE_PROVIDER_SITE_OTHER): Payer: Medicare Other | Admitting: Podiatry

## 2020-05-02 DIAGNOSIS — M79674 Pain in right toe(s): Secondary | ICD-10-CM | POA: Diagnosis not present

## 2020-05-02 DIAGNOSIS — B351 Tinea unguium: Secondary | ICD-10-CM | POA: Diagnosis not present

## 2020-05-02 DIAGNOSIS — M79675 Pain in left toe(s): Secondary | ICD-10-CM | POA: Diagnosis not present

## 2020-05-02 DIAGNOSIS — E1142 Type 2 diabetes mellitus with diabetic polyneuropathy: Secondary | ICD-10-CM

## 2020-05-02 DIAGNOSIS — E1169 Type 2 diabetes mellitus with other specified complication: Secondary | ICD-10-CM

## 2020-05-02 DIAGNOSIS — Z794 Long term (current) use of insulin: Secondary | ICD-10-CM
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
## 2020-05-04 ENCOUNTER — Other Ambulatory Visit: Payer: Self-pay

## 2020-05-04 ENCOUNTER — Encounter (HOSPITAL_BASED_OUTPATIENT_CLINIC_OR_DEPARTMENT_OTHER): Payer: Self-pay | Admitting: Emergency Medicine

## 2020-05-04 ENCOUNTER — Emergency Department (HOSPITAL_BASED_OUTPATIENT_CLINIC_OR_DEPARTMENT_OTHER)
Admission: EM | Admit: 2020-05-04 | Discharge: 2020-05-04 | Disposition: A | Discharge status: Home / Self Care | Payer: Medicare Other | Attending: Emergency Medicine | Admitting: Emergency Medicine

## 2020-05-04 DIAGNOSIS — L0201 Cutaneous abscess of face: Secondary | ICD-10-CM | POA: Diagnosis not present

## 2020-05-04 DIAGNOSIS — Z7982 Long term (current) use of aspirin: Secondary | ICD-10-CM | POA: Diagnosis not present

## 2020-05-04 DIAGNOSIS — Z79899 Other long term (current) drug therapy: Secondary | ICD-10-CM | POA: Diagnosis not present

## 2020-05-04 DIAGNOSIS — I509 Heart failure, unspecified: Secondary | ICD-10-CM | POA: Insufficient documentation

## 2020-05-04 DIAGNOSIS — E1122 Type 2 diabetes mellitus with diabetic chronic kidney disease: Secondary | ICD-10-CM | POA: Insufficient documentation

## 2020-05-04 DIAGNOSIS — I132 Hypertensive heart and chronic kidney disease with heart failure and with stage 5 chronic kidney disease, or end stage renal disease: Secondary | ICD-10-CM | POA: Insufficient documentation

## 2020-05-04 DIAGNOSIS — Z794 Long term (current) use of insulin: Secondary | ICD-10-CM | POA: Diagnosis not present

## 2020-05-04 DIAGNOSIS — N186 End stage renal disease: Secondary | ICD-10-CM | POA: Diagnosis not present

## 2020-05-04 DIAGNOSIS — E1165 Type 2 diabetes mellitus with hyperglycemia: Secondary | ICD-10-CM | POA: Diagnosis not present

## 2020-05-04 LAB — CBG MONITORING, ED: Glucose-Capillary: 226 mg/dL — ABNORMAL HIGH (ref 70–99)

## 2020-05-04 MED ORDER — DOXYCYCLINE HYCLATE 100 MG PO CAPS: 100.0000 mg | ORAL_CAPSULE | Freq: Two times a day (BID) | ORAL | 0 refills | Status: AC

## 2020-05-04 MED ORDER — DOXYCYCLINE HYCLATE 100 MG PO TABS
100.0000 mg | ORAL_TABLET | Freq: Once | ORAL | Status: AC
  Administered 2020-05-04: 100 mg via ORAL
  Filled 2020-05-04: qty 1

## 2020-05-04 MED FILL — DOXYCYCLINE HYCLATE 100 MG: 100 | 7 days supply | Qty: 14 | Fill #0

## 2020-05-04 NOTE — ED Triage Notes (Signed)
Pt c/o a cyst to L cheek x 1 week

## 2020-05-04 NOTE — ED Provider Notes (Signed)
[facility] EMERGENCY DEPARTMENT Provider Note   CSN: [id number] Arrival date & time: 05/04/20  1521     History Chief Complaint  Patient presents with  . Abscess    Danny Patrick. is a 59 y.o. male.  Patient with history of diabetes, CHF, ESRD --presents the emergency department with development of left cheek abscess starting 4 days ago.  Patient states that the area started with a pimple that he popped but then the area became more firm and tender.  No fevers, nausea or vomiting.  No treatments prior to arrival.  He denies dental pain.  No difficulty breathing or swallowing.        Past Medical History:  Diagnosis Date  . Anemia   . Arthritis   . CHF (congestive heart failure) (HCC)    chronic diastolic  . Diabetes mellitus ([place])    Type II  . ESRD (end stage renal disease) ([place])    Hemodialysis MWF, [facility]  . Hyperlipidemia    no meds  . Hypertension   . PONV (postoperative nausea and vomiting)     Patient Active Problem List   Diagnosis Date Noted  . Acute on chronic renal insufficiency 07/16/2018  . Recent weight gain 07/16/2018  . Murmur, cardiac 05/26/2018  . Lumbar back pain with radiculopathy affecting right lower extremity 12/24/2017  . 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 HNP (herniated nucleus pulposus), cervical 03/21/2016  . Chronic constipation 02/21/2016  . Family history of colonic polyps 02/21/2016  . Microalbuminuria due to type 2 diabetes mellitus ([place]) 01/02/2016  . Type 2 diabetes mellitus with hyperglycemia ([place]) 07/16/2015  . Vitamin D deficiency 07/16/2015  . Diabetic foot infection ([place]) 04/08/2014  . Diabetes mellitus ([place]) 04/08/2014  . Cellulitis of left foot 04/08/2014  . Essential hypertension 07/27/2013  . Morbid obesity with BMI of 40.0-44.9, adult ([place]) 07/27/2013  . Problems influencing health status 07/27/2013  . Severe obesity  (BMI 35.0-39.9) with comorbidity ([place]) 07/27/2013  . Testicular hypofunction 12/24/2012  . Family history of malignant neoplasm of prostate 12/02/2012  . Contracture of palmar fascia 10/19/2012  . Arthritis 10/22/2011  . Chronic pain 10/22/2011    Past Surgical History:  Procedure Laterality Date  . AV FISTULA PLACEMENT Left 09/13/2019   Procedure: CREATION OF RADIOCEPHALIC ARTERIOVENOUS FISTULA LEFT ARM;  Surgeon: Elam Dutch, MD;  Location: [facility] OR;  Service: Vascular;  Laterality: Left;  . COLONOSCOPY     polyp  . EYE SURGERY Right     r/t diabetes  . KNEE ARTHROSCOPY     Bilateral   . KNEE ARTHROSCOPY     x 2 - Bilateral  . NOSE SURGERY    . NOSE SURGERY         Family History  Problem Relation Age of Onset  . Diabetes type II Mother   . Multiple sclerosis Sister   . Diabetes Brother   . Coronary artery disease Brother   . Colon cancer Neg Hx     Social History   Tobacco Use  . Smoking status: Never Smoker  . Smokeless tobacco: Never Used  Vaping Use  . Vaping Use: Never used  Substance Use Topics  . Alcohol use: No  . Drug use: No    Home Medications Prior to Admission medications   Medication Sig Start Date End Date Taking? Authorizing Provider  albuterol (PROVENTIL HFA;VENTOLIN HFA) 108 (90 Base) MCG/ACT inhaler Inhale 1-2 puffs into  the lungs every 4 (four) hours as needed for wheezing or shortness of breath.  07/14/18   [provider]  aspirin EC 81 MG tablet Take 81 mg by mouth daily.     [provider]  atorvastatin (LIPITOR) 10 MG tablet Take 10 mg by mouth daily.  10/27/18   [provider]  b complex-vitamin c-folic acid (NEPHRO-VITE) 0.8 MG TABS tablet Take 1 tablet by mouth daily. 07/26/19   [provider]  BD PEN NEEDLE NANO U/F 32G X 4 MM MISC FOR INSULIN INJECTIONS QID 06/17/18   [provider]  Cholecalciferol (VITAMIN D-1000 MAX ST) 25 MCG (1000 UT) tablet Take 1,000 Units by mouth daily.   03/18/19   [provider]  cloNIDine (CATAPRES) 0.1 MG tablet Take 0.1 mg by mouth 2 (two) times daily.  04/28/19   [provider]  docusate sodium (COLACE) 100 MG capsule Take 100 mg by mouth daily as needed for moderate constipation.  07/30/18   [provider]  ferrous sulfate 325 (65 FE) MG tablet Take 325 mg by mouth daily with breakfast.     [provider]  Insulin Glargine (LANTUS SOLOSTAR) 100 UNIT/ML Solostar Pen Inject 1-12 Units into the skin daily as needed (if blood sugar gets above 180).     [provider]  Insulin Syringe-Needle U-100 (INSULIN SYRINGE .3CC/29GX1/2") 29G X 1/2" 0.3 ML MISC 1 each by Misc.(Non-Drug; Combo Route) route daily as needed. 08/19/18   [provider]  labetalol (NORMODYNE) 100 MG tablet Take 100 mg by mouth 4 (four) times daily.  10/27/18   [provider]  losartan (COZAAR) 100 MG tablet Take 100 mg by mouth daily.  07/07/18   [provider]  polyethylene glycol (MIRALAX / GLYCOLAX) packet Take 17 g by mouth daily as needed for moderate constipation.  10/27/18   [provider]  sevelamer carbonate (RENVELA) 800 MG tablet Take 800 mg by mouth 3 (three) times daily with meals.  03/17/19   [provider]  UNABLE TO FIND Inject into the muscle. 08/04/18   [provider]  UNABLE TO FIND 1 each by [place].(Non-Drug; Combo Route) route 4 (four) times daily as needed. 10/27/18   [provider]    Allergies    Penicillins, Vancomycin, Chocolate, Cocoa, Insulin aspart, and Lisinopril  Review of Systems   Review of Systems  Constitutional: Negative for fever.  HENT: Positive for facial swelling. Negative for dental problem.   Gastrointestinal: Negative for nausea and vomiting.  Skin: Negative for color change.       Positive for abscess  Hematological: Negative for adenopathy.    Physical Exam Updated Vital Signs BP 123/61 (BP Location: Right Arm)    Pulse 77   Temp 98.6 F (37 C) (Oral)   Resp 18   Ht 5\' 9"  (1.753 m)   Wt 98 kg   SpO2 97%   BMI 31.90 kg/m   Physical Exam Vitals and nursing note reviewed.  Constitutional:      Appearance: He is well-developed.  HENT:     Head: Normocephalic and atraumatic.     Mouth/Throat:     Comments: Patient with a 3 cm x 4 cm area of induration in the left cheek.  Patient with missing teeth, no obvious dental abscess.  Indurated area is tender.  No significant fluctuance. Eyes:     Conjunctiva/sclera: Conjunctivae normal.  Pulmonary:     Effort: No respiratory distress.  Musculoskeletal:  Cervical back: Normal range of motion and neck supple.  Skin:    General: Skin is warm and dry.  Neurological:     Mental Status: He is alert.     ED Results / Procedures / Treatments   Labs (all labs ordered are listed, but only abnormal results are displayed) Labs Reviewed  CBG MONITORING, ED    EKG None  Radiology No results found.  Procedures Procedures (including critical care time)  Medications Ordered in ED Medications  doxycycline (VIBRA-TABS) tablet 100 mg (has no administration in time range)    ED Course  I have reviewed the triage vital signs and the nursing notes.  Pertinent labs & imaging results that were available during my care of the patient were reviewed by me and considered in my medical decision making (see chart for details).  Patient seen and examined.  Will further evaluate abscess with ultrasound.  Will start patient on doxycycline (ensure no dosage adjustment given ESRD in up-to-date)  Vital signs reviewed and are as follows: BP 123/61 (BP Location: Right Arm)   Pulse 77   Temp 98.6 F (37 C) (Oral)   Resp 18   Ht 5\' 9"  (1.753 m)   Wt 98 kg   SpO2 97%   BMI 31.90 kg/m   EMERGENCY DEPARTMENT US SOFT TISSUE INTERPRETATION "Study: Limited Soft Tissue Ultrasound"  INDICATIONS: Soft tissue infection Multiple views of the body part were  obtained in real-time with a multi-frequency linear probe  PERFORMED BY: Myself IMAGES ARCHIVED?: Yes SIDE:Left BODY PART:left cheek INTERPRETATION:  Abcess present and Cellulitis present  5:20 PM patient with several small fluid collections on ultrasound.  There is a superficial area that I could likely aspirate.  Discussed options on how to proceed with patient.  We could give course of oral antibiotics, continue warm compresses, and have him follow-up in 48 hours for recheck.  I also offered attempt at needle aspiration acknowledging that I would be unlikely to completely drain the entire area given multiple small loculations.  Patient would like to trial the antibiotics for couple of days.  Discussed that if the area becomes larger or is not improved in 48 hours, he should return for recheck.  Encouraged return sooner with significantly worsening swelling, fevers.  He is scheduled for dialysis tomorrow.  Blood sugar was in the 200s.  No concern for DKA.     MDM Rules/Calculators/A&P                          Patient with left cheek abscess, poorly defined at this point.  Offered treatment options as above.  Patient would like to be discharged home on oral antibiotics and follow-up if symptoms are not improving.  I feel this is reasonable.  Likelihood of doing complete drainage with needle aspiration at this point is low given multiple small loculations.  Patient appears well, nontoxic.  I do not feel that he requires admission for IV antibiotics at this point.  He will need close monitoring over the next couple of days to ensure improvement.   Final Clinical Impression(s) / ED Diagnoses Final diagnoses:  Facial abscess    Rx / DC Orders ED Discharge Orders    None       Carlisle Cater, PA-C 05/04/20 1722    Drenda Freeze, MD 05/10/20 [phone]

## 2020-05-04 NOTE — Discharge Instructions (Signed)
Please read and follow all provided instructions.  Your diagnoses today include:  1. Facial abscess     Tests performed today include:  Vital signs. See below for your results today.   Medications prescribed:   Doxycycline - antibiotic  You have been prescribed an antibiotic medicine: take the entire course of medicine even if you are feeling better. Stopping early can cause the antibiotic not to work.  Take any prescribed medications only as directed.   Home care instructions:   Follow any educational materials contained in this packet  Follow-up instructions: Return to the Emergency Department in 48 hours for a recheck if your symptoms are not significantly improved.  Return instructions:  Return to the Emergency Department if you have:  Fever  Worsening symptoms  Worsening pain  Worsening swelling  Redness of the skin that moves away from the affected area, especially if it streaks away from the affected area   Any other emergent concerns   Your vital signs today were: BP 123/61 (BP Location: Right Arm)   Pulse 77   Temp 98.6 F (37 C) (Oral)   Resp 18   Ht 5\' 9"  (1.753 m)   Wt 98 kg   SpO2 97%   BMI 31.90 kg/m  If your blood pressure (BP) was elevated above 135/85 this visit, please have this repeated by your doctor within one month. --------------

## 2020-07-03 ENCOUNTER — Other Ambulatory Visit: Payer: Self-pay | Admitting: *Deleted

## 2020-07-03 DIAGNOSIS — N186 End stage renal disease: Secondary | ICD-10-CM

## 2020-07-11 ENCOUNTER — Other Ambulatory Visit: Payer: Self-pay

## 2020-07-11 ENCOUNTER — Encounter: Payer: Self-pay | Admitting: Podiatry

## 2020-07-11 ENCOUNTER — Ambulatory Visit (INDEPENDENT_AMBULATORY_CARE_PROVIDER_SITE_OTHER): Payer: Medicare Other | Admitting: Podiatry

## 2020-07-11 DIAGNOSIS — M79675 Pain in left toe(s): Secondary | ICD-10-CM | POA: Diagnosis not present

## 2020-07-11 DIAGNOSIS — E1142 Type 2 diabetes mellitus with diabetic polyneuropathy: Secondary | ICD-10-CM

## 2020-07-11 DIAGNOSIS — N189 Chronic kidney disease, unspecified: Secondary | ICD-10-CM

## 2020-07-11 DIAGNOSIS — N289 Disorder of kidney and ureter, unspecified: Secondary | ICD-10-CM | POA: Diagnosis not present

## 2020-07-11 DIAGNOSIS — B351 Tinea unguium: Secondary | ICD-10-CM

## 2020-07-11 DIAGNOSIS — M79674 Pain in right toe(s): Secondary | ICD-10-CM
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
## 2020-07-27 ENCOUNTER — Ambulatory Visit: Payer: Medicare Other | Admitting: Vascular Surgery

## 2020-07-27 ENCOUNTER — Encounter (HOSPITAL_COMMUNITY): Payer: Medicare Other

## 2020-07-27 ENCOUNTER — Other Ambulatory Visit (HOSPITAL_COMMUNITY): Payer: Medicare Other

## 2020-09-05 ENCOUNTER — Other Ambulatory Visit: Payer: Self-pay | Admitting: *Deleted

## 2020-09-05 DIAGNOSIS — N186 End stage renal disease: Secondary | ICD-10-CM

## 2020-09-19 ENCOUNTER — Ambulatory Visit (HOSPITAL_COMMUNITY)
Admission: RE | Admit: 2020-09-19 | Discharge: 2020-09-19 | Disposition: A | Discharge status: Home / Self Care | Payer: Medicare Other | Source: Ambulatory Visit | Attending: Vascular Surgery | Admitting: Vascular Surgery

## 2020-09-19 ENCOUNTER — Ambulatory Visit (INDEPENDENT_AMBULATORY_CARE_PROVIDER_SITE_OTHER): Payer: Medicare Other | Admitting: Vascular Surgery

## 2020-09-19 ENCOUNTER — Other Ambulatory Visit: Payer: Self-pay

## 2020-09-19 ENCOUNTER — Encounter: Payer: Self-pay | Admitting: Vascular Surgery

## 2020-09-19 ENCOUNTER — Other Ambulatory Visit: Payer: Self-pay | Admitting: Vascular Surgery

## 2020-09-19 VITALS — BP 199/81 | HR 80 | Temp 98.1°F | Resp 20 | Ht 69.0 in | Wt 216.0 lb

## 2020-09-19 DIAGNOSIS — Z992 Dependence on renal dialysis: Secondary | ICD-10-CM | POA: Insufficient documentation

## 2020-09-19 DIAGNOSIS — N186 End stage renal disease: Secondary | ICD-10-CM | POA: Insufficient documentation

## 2020-09-19 NOTE — Progress Notes (Signed)
Patient name: Danny Patrick. MRN: [id number] DOB: March 20, 1961 Sex: male  REASON FOR VISIT:   Follow-up after left radiocephalic AV fistula  HPI:   Danny Maharaj. is a pleasant 60 y.o. male who had a left radiocephalic AV fistula placed by Dr. Ruta Hinds on 09/13/2019.  He reports electric discomfort radiating from his fistula to his hand during cannulation.  He is curious if there is anything that can be done for this.  His fistula is working well.  He notes he has no discomfort when certain dialysis nurses cannulate the fistula.  Current Outpatient Medications  Medication Sig Dispense Refill  . ACETAMINOPHEN PO Take by mouth.    Marland Kitchen albuterol (PROVENTIL HFA;VENTOLIN HFA) 108 (90 Base) MCG/ACT inhaler Inhale 1-2 puffs into the lungs every 4 (four) hours as needed for wheezing or shortness of breath.     . ALPHAGAN P 0.1 % SOLN Place 1 drop into the right eye 3 (three) times daily.    Marland Kitchen aspirin EC 81 MG tablet Take 81 mg by mouth daily.     Marland Kitchen atorvastatin (LIPITOR) 10 MG tablet Take 10 mg by mouth daily.     . B Complex-C-Folic Acid (NEPHRO-VITE PO) Nephro-Vite    . BD PEN NEEDLE NANO U/F 32G X 4 MM MISC FOR INSULIN INJECTIONS QID    . Cholecalciferol (VITAMIN D-1000 MAX ST) 25 MCG (1000 UT) tablet Take 1,000 Units by mouth daily.     . cloNIDine (CATAPRES) 0.1 MG tablet Take 0.1 mg by mouth 2 (two) times daily.     Marland Kitchen DIPHENHYDRAMINE HCL PO Take by mouth.    . docusate sodium (COLACE) 100 MG capsule Take 100 mg by mouth daily as needed for moderate constipation.     . dorzolamide-timolol (COSOPT) 22.3-6.8 MG/ML ophthalmic solution Place 1 drop into the left eye 2 (two) times daily.    Marland Kitchen doxycycline (VIBRAMYCIN) 100 MG capsule Take 1 capsule (100 mg total) by mouth 2 (two) times daily. 14 capsule 0  . DUREZOL 0.05 % EMUL Place 1 drop into the right eye 2 (two) times daily.    . ferrous sulfate 325 (65 FE) MG tablet Take 325 mg by mouth daily with breakfast.     . insulin glargine (LANTUS)  100 UNIT/ML Solostar Pen Inject 1-12 Units into the skin daily as needed (if blood sugar gets above 180).     . Insulin Syringe-Needle U-100 (INSULIN SYRINGE .3CC/29GX1/2") 29G X 1/2" 0.3 ML MISC 1 each by Misc.(Non-Drug; Combo Route) route daily as needed.    Marland Kitchen ketorolac (ACULAR) 0.5 % ophthalmic solution Place 1 drop into the right eye 4 (four) times daily.    Marland Kitchen ketorolac (TORADOL) 10 MG tablet Take 10 mg by mouth every 6 (six) hours.    Marland Kitchen labetalol (NORMODYNE) 100 MG tablet Take 100 mg by mouth 4 (four) times daily.     Marland Kitchen losartan (COZAAR) 100 MG tablet Take 100 mg by mouth daily.     . polyethylene glycol (MIRALAX / GLYCOLAX) packet Take 17 g by mouth daily as needed for moderate constipation.     . sevelamer carbonate (RENVELA) 800 MG tablet Take 800 mg by mouth 3 (three) times daily with meals.     Marland Kitchen UNABLE TO FIND Inject into the muscle.    Marland Kitchen UNABLE TO FIND 1 each by Misc.(Non-Drug; Combo Route) route 4 (four) times daily as needed.     No current facility-administered medications for this visit.    REVIEW OF  SYSTEMS:  [X]  denotes positive finding, [ ]  denotes negative finding Vascular    Leg swelling    Cardiac    Chest pain or chest pressure:    Shortness of breath upon exertion:    Short of breath when lying flat:    Irregular heart rhythm:    Constitutional    Fever or chills:     PHYSICAL EXAM:   Vitals:   09/19/20 1433  BP: (!) 199/81  Pulse: 80  Resp: 20  Temp: 98.1 F (36.7 C)  SpO2: 97%  Weight: 216 lb (98 kg)  Height: 5\' 9"  (1.753 m)    GENERAL: The patient is a well-nourished male, in no acute distress. The vital signs are documented above. CARDIOVASCULAR: There is a regular rate and rhythm. PULMONARY: There is good air exchange bilaterally without wheezing or rales. His fistula has an excellent thrill.  He has a palpable radial pulse.  DATA:   DIALYSIS ACCESS DUPLEX:     MEDICAL ISSUES:   STATUS POST LEFT RADIOCEPHALIC AV FISTULA: His fistula  has matured nicely.  He reports electric type pain occasionally when his fistula is cannulated.  He feels this is from careless cannulation technique.  I explained to him that the only option to exclude a peripheral nerve from the fistula would be to expose the length of the fistula through a generous incision and skeletonized the structures.  This would render the fistula nonoperable for at least 6 weeks.  He is not interested in catheter-based dialysis and will monitor symptoms.  Return to care in 3 months with AV fistula duplex.  If the pain with cannulation becomes unbearable, I will offer a skeletonization of the fistula to him again.  Cherre Robins [facility] [place]

## 2020-09-20 ENCOUNTER — Other Ambulatory Visit: Payer: Self-pay

## 2020-09-20 DIAGNOSIS — N186 End stage renal disease: Secondary | ICD-10-CM

## 2020-09-26 ENCOUNTER — Ambulatory Visit: Payer: Medicare Other | Admitting: Podiatry

## 2020-12-19 ENCOUNTER — Ambulatory Visit (HOSPITAL_COMMUNITY): Admission: RE | Admit: 2020-12-19 | Payer: Medicare Other | Source: Ambulatory Visit

## 2020-12-19 ENCOUNTER — Ambulatory Visit: Payer: Medicare Other

## 2021-01-17 ENCOUNTER — Emergency Department (HOSPITAL_COMMUNITY)
Admission: EM | Admit: 2021-01-17 | Discharge: 2021-01-17 | Disposition: A | Discharge status: Home / Self Care | Payer: Medicare Other | Attending: Emergency Medicine | Admitting: Emergency Medicine

## 2021-01-17 ENCOUNTER — Other Ambulatory Visit: Payer: Self-pay

## 2021-01-17 ENCOUNTER — Emergency Department (HOSPITAL_COMMUNITY): Payer: Medicare Other

## 2021-01-17 ENCOUNTER — Encounter (HOSPITAL_COMMUNITY): Payer: Self-pay | Admitting: Emergency Medicine

## 2021-01-17 DIAGNOSIS — Z794 Long term (current) use of insulin: Secondary | ICD-10-CM | POA: Diagnosis not present

## 2021-01-17 DIAGNOSIS — E1129 Type 2 diabetes mellitus with other diabetic kidney complication: Secondary | ICD-10-CM | POA: Diagnosis not present

## 2021-01-17 DIAGNOSIS — E113519 Type 2 diabetes mellitus with proliferative diabetic retinopathy with macular edema, unspecified eye: Secondary | ICD-10-CM | POA: Insufficient documentation

## 2021-01-17 DIAGNOSIS — Z79899 Other long term (current) drug therapy: Secondary | ICD-10-CM | POA: Diagnosis not present

## 2021-01-17 DIAGNOSIS — Z7982 Long term (current) use of aspirin: Secondary | ICD-10-CM | POA: Diagnosis not present

## 2021-01-17 DIAGNOSIS — R202 Paresthesia of skin: Secondary | ICD-10-CM | POA: Diagnosis present

## 2021-01-17 DIAGNOSIS — Z992 Dependence on renal dialysis: Secondary | ICD-10-CM | POA: Insufficient documentation

## 2021-01-17 DIAGNOSIS — N186 End stage renal disease: Secondary | ICD-10-CM | POA: Insufficient documentation

## 2021-01-17 DIAGNOSIS — I5032 Chronic diastolic (congestive) heart failure: Secondary | ICD-10-CM | POA: Diagnosis not present

## 2021-01-17 DIAGNOSIS — R200 Anesthesia of skin: Secondary | ICD-10-CM

## 2021-01-17 DIAGNOSIS — I132 Hypertensive heart and chronic kidney disease with heart failure and with stage 5 chronic kidney disease, or end stage renal disease: Secondary | ICD-10-CM | POA: Diagnosis not present

## 2021-01-17 LAB — CBC WITH DIFFERENTIAL/PLATELET
Abs Immature Granulocytes: 0.05 10*3/uL (ref 0.00–0.07)
Basophils Absolute: 0.1 10*3/uL (ref 0.0–0.1)
Basophils Relative: 1 %
Eosinophils Absolute: 0.2 10*3/uL (ref 0.0–0.5)
Eosinophils Relative: 2 %
HCT: 33.4 % — ABNORMAL LOW (ref 39.0–52.0)
Hemoglobin: 10.9 g/dL — ABNORMAL LOW (ref 13.0–17.0)
Immature Granulocytes: 1 %
Lymphocytes Relative: 22 %
Lymphs Abs: 2.3 10*3/uL (ref 0.7–4.0)
MCH: 28.6 pg (ref 26.0–34.0)
MCHC: 32.6 g/dL (ref 30.0–36.0)
MCV: 87.7 fL (ref 80.0–100.0)
Monocytes Absolute: 0.7 10*3/uL (ref 0.1–1.0)
Monocytes Relative: 7 %
Neutro Abs: 7.1 10*3/uL (ref 1.7–7.7)
Neutrophils Relative %: 67 %
Platelets: 236 10*3/uL (ref 150–400)
RBC: 3.81 MIL/uL — ABNORMAL LOW (ref 4.22–5.81)
RDW: 15.5 % (ref 11.5–15.5)
WBC: 10.3 10*3/uL (ref 4.0–10.5)
nRBC: 0 % (ref 0.0–0.2)

## 2021-01-17 LAB — COMPREHENSIVE METABOLIC PANEL
ALT: 23 U/L (ref 0–44)
AST: 14 U/L — ABNORMAL LOW (ref 15–41)
Albumin: 4.2 g/dL (ref 3.5–5.0)
Alkaline Phosphatase: 98 U/L (ref 38–126)
Anion gap: 12 (ref 5–15)
BUN: 31 mg/dL — ABNORMAL HIGH (ref 6–20)
CO2: 30 mmol/L (ref 22–32)
Calcium: 9 mg/dL (ref 8.9–10.3)
Chloride: 93 mmol/L — ABNORMAL LOW (ref 98–111)
Creatinine, Ser: 6.82 mg/dL — ABNORMAL HIGH (ref 0.61–1.24)
GFR, Estimated: 9 mL/min — ABNORMAL LOW (ref >60)
Glucose, Bld: 169 mg/dL — ABNORMAL HIGH (ref 70–99)
Potassium: 3.3 mmol/L — ABNORMAL LOW (ref 3.5–5.1)
Sodium: 135 mmol/L (ref 135–145)
Total Bilirubin: 0.5 mg/dL (ref 0.3–1.2)
Total Protein: 8 g/dL (ref 6.5–8.1)

## 2021-01-17 MED ORDER — LORAZEPAM 2 MG/ML IJ SOLN
1.0000 mg | Freq: Once | INTRAMUSCULAR | Status: AC | PRN
  Administered 2021-01-17: 1 mg via INTRAVENOUS
  Filled 2021-01-17: qty 1

## 2021-01-17 NOTE — ED Provider Notes (Signed)
[facility] EMERGENCY DEPARTMENT Provider Note   CSN: [id number] Arrival date & time: 01/17/21  1707     History Chief Complaint  Patient presents with  . Numbness    Danny Patrick. is a 60 y.o. male.  HPI     60 year old male comes in a chief complaint of numbness.  Patient has history of CHF, diabetes, ESRD on HD, TIA.  He reports that he woke up this morning and had numbness over his forehead before his dialysis.  When he was heading to dialysis he noted some heaviness and numbness to the right leg.  Patient has family history of MS. he is concerned about possibly having a stroke.  Patient denies any chest pain, shortness of breath, back pain.  Review of system is negative for any headaches, dizziness, vision change.   Past Medical History:  Diagnosis Date  . Anemia   . Arthritis   . CHF (congestive heart failure) (HCC)    chronic diastolic  . Diabetes mellitus ([place])    Type II  . ESRD (end stage renal disease) ([place])    Hemodialysis MWF, [facility]  . Hyperlipidemia    no meds  . Hypertension   . PONV (postoperative nausea and vomiting)     Patient Active Problem List   Diagnosis Date Noted  . Allergy, unspecified, sequela 04/24/2020  . Hyperkalemia 06/09/2019  . Anemia in chronic kidney disease 04/29/2019  . Coagulation defect, unspecified ([place]) 04/29/2019  . Encounter for fitting and adjustment of extracorporeal dialysis catheter ([place]) 04/29/2019  . End stage renal disease ([place]) 04/29/2019  . Hyperlipidemia, unspecified 04/29/2019  . Iron deficiency anemia, unspecified 04/29/2019  . Pruritus, unspecified 04/29/2019  . Secondary hyperparathyroidism of renal origin ([place]) 04/29/2019  . Shortness of breath 04/29/2019  . ESRD on hemodialysis ([place]) 04/25/2019  . Chronic diastolic CHF (congestive heart failure) ([place]) 04/14/2019  . AKI (acute kidney injury) ([place]) 03/03/2019  . History of sepsis 03/03/2019  . Sepsis ([place]) 03/03/2019  .  Choledocholithiasis 02/19/2019  . Acute on chronic renal insufficiency 07/16/2018  . Recent weight gain 07/16/2018  . Murmur, cardiac 05/26/2018  . Lumbar back pain with radiculopathy affecting right lower extremity 12/24/2017  . 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 HNP (herniated nucleus pulposus), cervical 03/21/2016  . Chronic constipation 02/21/2016  . Family history of colonic polyps 02/21/2016  . Microalbuminuria due to type 2 diabetes mellitus ([place]) 01/02/2016  . Type 2 diabetes mellitus with hyperglycemia ([place]) 07/16/2015  . Vitamin D deficiency 07/16/2015  . Diabetic foot infection ([place]) 04/08/2014  . Diabetes mellitus ([place]) 04/08/2014  . Cellulitis of left foot 04/08/2014  . Essential hypertension 07/27/2013  . Morbid obesity with BMI of 40.0-44.9, adult ([place]) 07/27/2013  . Problems influencing health status 07/27/2013  . Severe obesity (BMI 35.0-39.9) with comorbidity ([place]) 07/27/2013  . Testicular hypofunction 12/24/2012  . Family history of malignant neoplasm of prostate 12/02/2012  . Contracture of palmar fascia 10/19/2012  . Arthritis 10/22/2011  . Chronic pain 10/22/2011    Past Surgical History:  Procedure Laterality Date  . AV FISTULA PLACEMENT Left 09/13/2019   Procedure: CREATION OF RADIOCEPHALIC ARTERIOVENOUS FISTULA LEFT ARM;  Surgeon: Elam Dutch, MD;  Location: [facility] OR;  Service: Vascular;  Laterality: Left;  . COLONOSCOPY     polyp  . EYE SURGERY Right     r/t diabetes  . KNEE ARTHROSCOPY     Bilateral   .  KNEE ARTHROSCOPY     x 2 - Bilateral  . NOSE SURGERY    . NOSE SURGERY         Family History  Problem Relation Age of Onset  . Diabetes type II Mother   . Multiple sclerosis Sister   . Diabetes Brother   . Coronary artery disease Brother   . Colon cancer Neg Hx     Social History   Tobacco Use  . Smoking status: Never Smoker  . Smokeless tobacco: Never  Used  Vaping Use  . Vaping Use: Never used  Substance Use Topics  . Alcohol use: No  . Drug use: No    Home Medications Prior to Admission medications   Medication Sig Start Date End Date Taking? Authorizing Provider  ACETAMINOPHEN PO Take by mouth. 05/01/20 04/25/2021  [provider]  albuterol (PROVENTIL HFA;VENTOLIN HFA) 108 (90 Base) MCG/ACT inhaler Inhale 1-2 puffs into the lungs every 4 (four) hours as needed for wheezing or shortness of breath.  07/14/18   [provider]  ALPHAGAN P 0.1 % SOLN Place 1 drop into the right eye 3 (three) times daily. 05/17/20   [provider]  aspirin EC 81 MG tablet Take 81 mg by mouth daily.     [provider]  atorvastatin (LIPITOR) 10 MG tablet Take 10 mg by mouth daily.  10/27/18   [provider]  B Complex-C-Folic Acid (NEPHRO-VITE PO) Nephro-Vite 06/26/20   [provider]  BD PEN NEEDLE NANO U/F 32G X 4 MM MISC FOR INSULIN INJECTIONS QID 06/17/18   [provider]  Cholecalciferol (VITAMIN D-1000 MAX ST) 25 MCG (1000 UT) tablet Take 1,000 Units by mouth daily.  03/18/19   [provider]  cloNIDine (CATAPRES) 0.1 MG tablet Take 0.1 mg by mouth 2 (two) times daily.  04/28/19   [provider]  DIPHENHYDRAMINE HCL PO Take by mouth. 05/01/20 04/22/2021  [provider]  docusate sodium (COLACE) 100 MG capsule Take 100 mg by mouth daily as needed for moderate constipation.  07/30/18   [provider]  dorzolamide-timolol (COSOPT) 22.3-6.8 MG/ML ophthalmic solution Place 1 drop into the left eye 2 (two) times daily. 05/12/20   [provider]  doxycycline (VIBRAMYCIN) 100 MG capsule Take 1 capsule (100 mg total) by mouth 2 (two) times daily. 05/04/20   Carlisle Cater, PA-C  DUREZOL 0.05 % EMUL Place 1 drop into the right eye 2 (two) times daily. 06/02/20   [provider]  ferrous sulfate 325 (65 FE) MG tablet Take 325 mg by mouth daily with  breakfast.     [provider]  insulin glargine (LANTUS) 100 UNIT/ML Solostar Pen Inject 1-12 Units into the skin daily as needed (if blood sugar gets above 180).     [provider]  Insulin Syringe-Needle U-100 (INSULIN SYRINGE .3CC/29GX1/2") 29G X 1/2" 0.3 ML MISC 1 each by Misc.(Non-Drug; Combo Route) route daily as needed. 08/19/18   [provider]  ketorolac (ACULAR) 0.5 % ophthalmic solution Place 1 drop into the right eye 4 (four) times daily. 05/09/20   [provider]  ketorolac (TORADOL) 10 MG tablet Take 10 mg by mouth every 6 (six) hours. 05/11/20   [provider]  labetalol (NORMODYNE) 100 MG tablet Take 100 mg by mouth 4 (four) times daily.  10/27/18   [provider]  losartan (COZAAR) 100 MG tablet Take 100 mg by mouth daily.  07/07/18   [provider]  polyethylene  glycol (MIRALAX / GLYCOLAX) packet Take 17 g by mouth daily as needed for moderate constipation.  10/27/18   [provider]  sevelamer carbonate (RENVELA) 800 MG tablet Take 800 mg by mouth 3 (three) times daily with meals.  03/17/19   [provider]  UNABLE TO FIND Inject into the muscle. 08/04/18   [provider]  UNABLE TO FIND 1 each by Amber.(Non-Drug; Combo Route) route 4 (four) times daily as needed. 10/27/18   [provider]    Allergies    Penicillins, Vancomycin, Chocolate, Cocoa, Insulin aspart, Iron sucrose, Other, and Lisinopril  Review of Systems   Review of Systems  Constitutional: Positive for activity change.  Eyes: Negative for visual disturbance.  Respiratory: Positive for shortness of breath.   Gastrointestinal: Negative for nausea and vomiting.  Neurological: Positive for numbness.  Hematological: Does not bruise/bleed easily.  All other systems reviewed and are negative.   Physical Exam Updated Vital Signs BP (!) 158/70 (BP Location: Left Arm)   Pulse 70   Temp 97.9 F (36.6 C) (Oral)    Resp 13   SpO2 100%   Physical Exam Vitals and nursing note reviewed.  Constitutional:      Appearance: He is well-developed.  HENT:     Head: Atraumatic.  Eyes:     Extraocular Movements: Extraocular movements intact.     Pupils: Pupils are equal, round, and reactive to light.     Comments: Right-sided eye droop -baseline  Cardiovascular:     Rate and Rhythm: Normal rate.  Pulmonary:     Effort: Pulmonary effort is normal.  Musculoskeletal:     Cervical back: Neck supple.  Skin:    General: Skin is warm.  Neurological:     Mental Status: He is alert and oriented to person, place, and time.     Cranial Nerves: No cranial nerve deficit.     Sensory: No sensory deficit.     Motor: No weakness.     Coordination: Coordination normal.     ED Results / Procedures / Treatments   Labs (all labs ordered are listed, but only abnormal results are displayed) Labs Reviewed  COMPREHENSIVE METABOLIC PANEL - Abnormal; Notable for the following components:      Result Value   Potassium 3.3 (*)    Chloride 93 (*)    Glucose, Bld 169 (*)    BUN 31 (*)    Creatinine, Ser 6.82 (*)    AST 14 (*)    GFR, Estimated 9 (*)    All other components within normal limits  CBC WITH DIFFERENTIAL/PLATELET - Abnormal; Notable for the following components:   RBC 3.81 (*)    Hemoglobin 10.9 (*)    HCT 33.4 (*)    All other components within normal limits    EKG None  Radiology CT Head Wo Contrast  Result Date: 01/17/2021 CLINICAL DATA:  Headache with forehead numbness for 2 days. Right-sided facial, arm, and leg numbness. Difficulty walking. Elevated blood pressure. EXAM: CT HEAD WITHOUT CONTRAST TECHNIQUE: Contiguous axial images were obtained from the base of the skull through the vertex without intravenous contrast. COMPARISON:  None. FINDINGS: Brain: Normal brain volume for age. No intracranial hemorrhage, mass effect, or midline shift. No hydrocephalus. The basilar cisterns are patent. No  evidence of territorial infarct or acute ischemia. No extra-axial or intracranial fluid collection. Vascular: No hyperdense vessel. Skull: No fracture or focal lesion. Sinuses/Orbits: No acute findings. The paranasal sinuses and mastoid air cells are  clear. Right eye surgery. Other: Multiple scalp lesions, ranging from 1-2 cm, some of which have some peripheral calcification. Findings typically represent sebaceous cysts. IMPRESSION: 1. No acute intracranial abnormality. 2. Multiple scalp lesions, ranging from 1-2 cm, some of which have some peripheral calcification. Findings typically represent sebaceous cysts. Recommend correlation with physical exam. Electronically Signed   By: Keith Rake M.D.   On: 01/17/2021 17:45   MR BRAIN WO CONTRAST  Result Date: 01/17/2021 CLINICAL DATA:  Initial evaluation for neuro deficit, stroke suspected. EXAM: MRI HEAD WITHOUT CONTRAST TECHNIQUE: Multiplanar, multiecho pulse sequences of the brain and surrounding structures were obtained without intravenous contrast. COMPARISON:  Prior head CT from earlier the same day. FINDINGS: Brain: Cerebral volume within normal limits for age. Remote hemorrhagic lacunar infarct noted involving the left thalamocapsular region (series 17, image 31). No other chronic ischemic changes seen within the brain. No significant cerebral white matter disease for age. No findings to suggest demyelinating disease. No abnormal foci of restricted diffusion to suggest acute or subacute ischemia. Gray-white matter differentiation maintained. No encephalomalacia to suggest chronic cortical infarction. No other evidence for acute or chronic intracranial hemorrhage. No mass lesion, midline shift or mass effect. Ventricles normal size without hydrocephalus. No extra-axial fluid collection. Pituitary gland suprasellar region normal. Midline structures intact. Vascular: Major intracranial vascular flow voids are well maintained. Skull and upper cervical  spine: Craniocervical junction within normal limits. Bone marrow signal intensity normal. Multiple probable sebaceous cyst noted about the posterior scalp. Scalp soft tissues demonstrate no acute finding. Sinuses/Orbits: Chronic changes noted about the globes, with subretinal effusion on the left. Globes and orbital soft tissues demonstrate no acute finding. Paranasal sinuses are clear. No mastoid effusion. Inner ear structures grossly normal. Other: None. IMPRESSION: 1. No acute intracranial abnormality. 2. 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 Electronically Signed   By: Jeannine Boga M.D.   On: 01/17/2021 21:59    Procedures Procedures   Medications Ordered in ED Medications  LORazepam (ATIVAN) injection 1 mg (1 mg Intravenous Given 01/17/21 2035)    ED Course  I have reviewed the triage vital signs and the nursing notes.  Pertinent labs & imaging results that were available during my care of the patient were reviewed by me and considered in my medical decision making (see chart for details).    MDM Rules/Calculators/A&P                          60 year old comes in a chief complaint of numbness.  Patient has history of TIA, ESRD on HD. Patient also has family history of MS.  On exam, there is no focal neurodeficits besides some subjective numbness over the leg.  Symptoms of face involvement, dexterity issues, leg numbness is not consistent with a specific vascular territory leading to stroke, but more consistent with MS type findings.  We will get MRI without contrast to see if there is any evidence of plaques consistent with MS.  11:22 PM MRI did not show evidence of plaque, but it did show old lacunar infarct with hemorrhagic ischemia.  This is not a new stroke area, likely an old stroke that had hemorrhagic changes.  Again, clinically not significant with patient's finding.  I did discuss those findings with the patient and advised  him to follow-up with neurology as an outpatient to ensure he is getting optimal prevention management.  Final Clinical Impression(s) / ED Diagnoses Final diagnoses:  Numbness  Rx / DC Orders ED Discharge Orders    None       Varney Biles, MD 01/17/21 [phone]

## 2021-01-17 NOTE — ED Provider Notes (Signed)
Emergency Medicine Provider Triage Evaluation Note  Danny Patrick. 60 y.o. male was evaluated in triage.  Pt complains of headache that has been ongoing for last couple days.  He reports that today when he was going to dialysis earlier this morning, he noticed that he felt like his right leg was little bit weaker.  He states that he went to dialysis.  They had to start and stop several times because he was having some hypotension.  He states that is normal for him.  At about 3:00 when he went home, he states he was making sandwich and states he wiped his forehead and felt like both sides of his forehead were slightly numb.  He also reports numbness to his fingertips.  No weakness.  Denies any chest pain, difficulty breathing, falls.  No vomiting, abdominal pain   Review of Systems  Positive: Headache, numbness, right leg weakness Negative: Chest pain, difficulty breathing, vomiting, abdominal pain.  Physical Exam  BP 134/82   Pulse 70   Temp 98.2 F (36.8 C) (Oral)   Resp 18   Ht '5\' 4"'$  (1.626 m)   Wt 65.8 kg   SpO2 100%   BMI 24.89 kg/m  Gen:   Awake, no distress   HEENT:  Atraumatic  Resp:  Normal effort  Cardiac:  Normal rate.  Fistula noted in left upper extremity. Abd:   Nondistended, nontender  MSK:   Moves extremities without difficulty  Neuro:  Speech clear.  5/5 strength bilateral upper extremities.  Slightly diminished strength in the right leg.  No facial droop.  Reports some decrease sensation to the forehead bilaterally.  Sensation the same on the V2, V3 distribution.  Other:   N/AA  Medical Decision Making  Medically screening exam initiated at 5:11 PM  Appropriate orders placed.  Danny Patrick. was informed that the remainder of the evaluation will be completed by another provider, this initial triage assessment does not replace that evaluation, and the importance of remaining in the ED until their evaluation is complete.  Patient states that his right leg weakness  started earlier this morning.  He did have some numbness and tingling into bilateral forehead, bilateral fingertips.  This is not consistent with CVA presentation.  Given that the right leg weakness has been ongoing for greater than 4 hours, he is outside the stroke window.  Clinical Impression  HA, numbness, leg weakness   Portions of this note were generated with Dragon dictation software. Dictation errors may occur despite best attempts at proofreading.     Volanda Napoleon, PA-C 01/17/21 1713    Danny Chapel, MD 01/17/21 2340

## 2021-01-17 NOTE — ED Notes (Signed)
Patient main complaint is numbness in right leg and on forehead at this time. Patient appears in no acute distress. BP has gone up. Patient reports being taken off of many medications due to losing weight.

## 2021-01-17 NOTE — Discharge Instructions (Signed)
Please see the Neurologist in 1-2 weeks. MRI shows old stroke. No signs of MS.

## 2021-01-17 NOTE — ED Triage Notes (Signed)
Pt BIB [facility] from home, c/o headache with forehead numbness x 2 days. Reports right leg weakness that started this morning, and difficulty walking. Initially hypertensive with EMS, SBP 200. A&O x 4.

## 2021-01-18 ENCOUNTER — Ambulatory Visit (HOSPITAL_COMMUNITY)
Admission: RE | Admit: 2021-01-18 | Payer: Medicare Other | Source: Ambulatory Visit | Attending: Vascular Surgery | Admitting: Vascular Surgery

## 2021-01-18 ENCOUNTER — Ambulatory Visit: Payer: Medicare Other

## 2021-02-01 ENCOUNTER — Ambulatory Visit (HOSPITAL_COMMUNITY): Payer: Medicare Other | Attending: Vascular Surgery

## 2021-02-01 ENCOUNTER — Ambulatory Visit: Payer: Medicare Other

## 2021-02-12 ENCOUNTER — Encounter: Payer: Self-pay | Admitting: Vascular Surgery

## 2021-02-14 ENCOUNTER — Encounter: Payer: Self-pay | Admitting: Vascular Surgery

## 2021-04-30 ENCOUNTER — Inpatient Hospital Stay (HOSPITAL_COMMUNITY)
Admission: EM | Admit: 2021-04-30 | Discharge: 2021-05-10 | DRG: 207 | Disposition: E | Payer: Medicare Other | Attending: Pulmonary Disease | Admitting: Pulmonary Disease

## 2021-04-30 ENCOUNTER — Other Ambulatory Visit: Payer: Self-pay

## 2021-04-30 ENCOUNTER — Emergency Department (HOSPITAL_COMMUNITY): Payer: Medicare Other

## 2021-04-30 ENCOUNTER — Encounter (HOSPITAL_COMMUNITY): Payer: Self-pay | Admitting: Pulmonary Disease

## 2021-04-30 ENCOUNTER — Inpatient Hospital Stay (HOSPITAL_COMMUNITY): Payer: Medicare Other

## 2021-04-30 DIAGNOSIS — R579 Shock, unspecified: Secondary | ICD-10-CM | POA: Diagnosis not present

## 2021-04-30 DIAGNOSIS — G253 Myoclonus: Secondary | ICD-10-CM | POA: Diagnosis not present

## 2021-04-30 DIAGNOSIS — I469 Cardiac arrest, cause unspecified: Secondary | ICD-10-CM

## 2021-04-30 DIAGNOSIS — E875 Hyperkalemia: Secondary | ICD-10-CM | POA: Diagnosis not present

## 2021-04-30 DIAGNOSIS — N19 Unspecified kidney failure: Secondary | ICD-10-CM

## 2021-04-30 DIAGNOSIS — I132 Hypertensive heart and chronic kidney disease with heart failure and with stage 5 chronic kidney disease, or end stage renal disease: Secondary | ICD-10-CM | POA: Diagnosis present

## 2021-04-30 DIAGNOSIS — Z6839 Body mass index (BMI) 39.0-39.9, adult: Secondary | ICD-10-CM

## 2021-04-30 DIAGNOSIS — Z452 Encounter for adjustment and management of vascular access device: Secondary | ICD-10-CM | POA: Diagnosis not present

## 2021-04-30 DIAGNOSIS — R569 Unspecified convulsions: Secondary | ICD-10-CM | POA: Diagnosis not present

## 2021-04-30 DIAGNOSIS — E1122 Type 2 diabetes mellitus with diabetic chronic kidney disease: Secondary | ICD-10-CM | POA: Diagnosis present

## 2021-04-30 DIAGNOSIS — J9602 Acute respiratory failure with hypercapnia: Secondary | ICD-10-CM | POA: Diagnosis present

## 2021-04-30 DIAGNOSIS — E874 Mixed disorder of acid-base balance: Secondary | ICD-10-CM | POA: Diagnosis not present

## 2021-04-30 DIAGNOSIS — M898X9 Other specified disorders of bone, unspecified site: Secondary | ICD-10-CM | POA: Diagnosis present

## 2021-04-30 DIAGNOSIS — E1165 Type 2 diabetes mellitus with hyperglycemia: Secondary | ICD-10-CM | POA: Diagnosis present

## 2021-04-30 DIAGNOSIS — E669 Obesity, unspecified: Secondary | ICD-10-CM | POA: Diagnosis present

## 2021-04-30 DIAGNOSIS — D631 Anemia in chronic kidney disease: Secondary | ICD-10-CM | POA: Diagnosis present

## 2021-04-30 DIAGNOSIS — Z634 Disappearance and death of family member: Secondary | ICD-10-CM

## 2021-04-30 DIAGNOSIS — Z8616 Personal history of COVID-19: Secondary | ICD-10-CM | POA: Diagnosis not present

## 2021-04-30 DIAGNOSIS — Z66 Do not resuscitate: Secondary | ICD-10-CM | POA: Diagnosis present

## 2021-04-30 DIAGNOSIS — J690 Pneumonitis due to inhalation of food and vomit: Secondary | ICD-10-CM | POA: Diagnosis not present

## 2021-04-30 DIAGNOSIS — N186 End stage renal disease: Secondary | ICD-10-CM

## 2021-04-30 DIAGNOSIS — Z515 Encounter for palliative care: Secondary | ICD-10-CM | POA: Diagnosis not present

## 2021-04-30 DIAGNOSIS — I468 Cardiac arrest due to other underlying condition: Secondary | ICD-10-CM | POA: Diagnosis present

## 2021-04-30 DIAGNOSIS — E873 Alkalosis: Secondary | ICD-10-CM

## 2021-04-30 DIAGNOSIS — G40901 Epilepsy, unspecified, not intractable, with status epilepticus: Secondary | ICD-10-CM | POA: Diagnosis present

## 2021-04-30 DIAGNOSIS — J9601 Acute respiratory failure with hypoxia: Secondary | ICD-10-CM | POA: Diagnosis present

## 2021-04-30 DIAGNOSIS — K7200 Acute and subacute hepatic failure without coma: Secondary | ICD-10-CM | POA: Diagnosis present

## 2021-04-30 DIAGNOSIS — R7881 Bacteremia: Secondary | ICD-10-CM | POA: Diagnosis not present

## 2021-04-30 DIAGNOSIS — R609 Edema, unspecified: Secondary | ICD-10-CM | POA: Diagnosis not present

## 2021-04-30 DIAGNOSIS — I5031 Acute diastolic (congestive) heart failure: Secondary | ICD-10-CM | POA: Diagnosis present

## 2021-04-30 DIAGNOSIS — R140 Abdominal distension (gaseous): Secondary | ICD-10-CM | POA: Diagnosis present

## 2021-04-30 DIAGNOSIS — Z992 Dependence on renal dialysis: Secondary | ICD-10-CM

## 2021-04-30 DIAGNOSIS — G9341 Metabolic encephalopathy: Secondary | ICD-10-CM | POA: Diagnosis present

## 2021-04-30 DIAGNOSIS — Z79899 Other long term (current) drug therapy: Secondary | ICD-10-CM

## 2021-04-30 DIAGNOSIS — G931 Anoxic brain damage, not elsewhere classified: Secondary | ICD-10-CM | POA: Diagnosis present

## 2021-04-30 DIAGNOSIS — R001 Bradycardia, unspecified: Secondary | ICD-10-CM | POA: Diagnosis not present

## 2021-04-30 DIAGNOSIS — B958 Unspecified staphylococcus as the cause of diseases classified elsewhere: Secondary | ICD-10-CM | POA: Diagnosis present

## 2021-04-30 DIAGNOSIS — Z9115 Patient's noncompliance with renal dialysis: Secondary | ICD-10-CM

## 2021-04-30 HISTORY — DX: Cardiac murmur, unspecified: R01.1

## 2021-04-30 HISTORY — DX: Anemia, unspecified: D64.9

## 2021-04-30 HISTORY — DX: End stage renal disease: N18.6

## 2021-04-30 HISTORY — DX: Obesity, unspecified: E66.9

## 2021-04-30 HISTORY — DX: Essential (primary) hypertension: I10

## 2021-04-30 HISTORY — DX: Type 2 diabetes mellitus without complications: E11.9

## 2021-04-30 LAB — COMPREHENSIVE METABOLIC PANEL
ALT: 47 U/L — ABNORMAL HIGH (ref 0–44)
AST: 72 U/L — ABNORMAL HIGH (ref 15–41)
Albumin: 3.2 g/dL — ABNORMAL LOW (ref 3.5–5.0)
Alkaline Phosphatase: 88 U/L (ref 38–126)
Anion gap: 21 — ABNORMAL HIGH (ref 5–15)
BUN: 98 mg/dL — ABNORMAL HIGH (ref 6–20)
CO2: 14 mmol/L — ABNORMAL LOW (ref 22–32)
Calcium: 7.2 mg/dL — ABNORMAL LOW (ref 8.9–10.3)
Chloride: 104 mmol/L (ref 98–111)
Creatinine, Ser: 15.02 mg/dL — ABNORMAL HIGH (ref 0.61–1.24)
GFR, Estimated: 3 mL/min — ABNORMAL LOW (ref >60)
Glucose, Bld: 207 mg/dL — ABNORMAL HIGH (ref 70–99)
Potassium: 3.9 mmol/L (ref 3.5–5.1)
Sodium: 139 mmol/L (ref 135–145)
Total Bilirubin: 1 mg/dL (ref 0.3–1.2)
Total Protein: 6.5 g/dL (ref 6.5–8.1)

## 2021-04-30 LAB — ECHOCARDIOGRAM COMPLETE
AR max vel: 2.81 cm2
AV Area VTI: 3.07 cm2
AV Area mean vel: 2.55 cm2
AV Mean grad: 4 mmHg
AV Peak grad: 7 mmHg
Ao pk vel: 1.32 m/s
Area-P 1/2: 3.53 cm2
Height: 69 in
S' Lateral: 3 cm
Weight: 4232.83 oz

## 2021-04-30 LAB — I-STAT ARTERIAL BLOOD GAS, ED
Acid-base deficit: 11 mmol/L — ABNORMAL HIGH (ref 0.0–2.0)
Acid-base deficit: 6 mmol/L — ABNORMAL HIGH (ref 0.0–2.0)
Bicarbonate: 18.2 mmol/L — ABNORMAL LOW (ref 20.0–28.0)
Bicarbonate: 19.7 mmol/L — ABNORMAL LOW (ref 20.0–28.0)
Calcium, Ion: 0.97 mmol/L — ABNORMAL LOW (ref 1.15–1.40)
Calcium, Ion: 0.92 mmol/L — ABNORMAL LOW (ref 1.15–1.40)
HCT: 28 % — ABNORMAL LOW (ref 39.0–52.0)
HCT: 28 % — ABNORMAL LOW (ref 39.0–52.0)
Hemoglobin: 9.5 g/dL — ABNORMAL LOW (ref 13.0–17.0)
Hemoglobin: 9.5 g/dL — ABNORMAL LOW (ref 13.0–17.0)
O2 Saturation: 98 %
O2 Saturation: 100 %
Potassium: 3.6 mmol/L (ref 3.5–5.1)
Potassium: 4.5 mmol/L (ref 3.5–5.1)
Sodium: 140 mmol/L (ref 135–145)
Sodium: 137 mmol/L (ref 135–145)
TCO2: 20 mmol/L — ABNORMAL LOW (ref 22–32)
TCO2: 21 mmol/L — ABNORMAL LOW (ref 22–32)
pCO2 arterial: 54.3 mmHg — ABNORMAL HIGH (ref 32.0–48.0)
pCO2 arterial: 38.6 mmHg (ref 32.0–48.0)
pH, Arterial: 7.134 — CL (ref 7.350–7.450)
pH, Arterial: 7.316 — ABNORMAL LOW (ref 7.350–7.450)
pO2, Arterial: 128 mmHg — ABNORMAL HIGH (ref 83.0–108.0)
pO2, Arterial: 264 mmHg — ABNORMAL HIGH (ref 83.0–108.0)

## 2021-04-30 LAB — RAPID URINE DRUG SCREEN, HOSP PERFORMED
Amphetamines: NOT DETECTED
Barbiturates: NOT DETECTED
Benzodiazepines: NOT DETECTED
Cocaine: NOT DETECTED
Opiates: NOT DETECTED
Tetrahydrocannabinol: NOT DETECTED

## 2021-04-30 LAB — URINALYSIS, ROUTINE W REFLEX MICROSCOPIC
Bilirubin Urine: NEGATIVE
Glucose, UA: 150 mg/dL — AB
Ketones, ur: NEGATIVE mg/dL
Leukocytes,Ua: NEGATIVE
Nitrite: NEGATIVE
Protein, ur: >=300 mg/dL — AB
Specific Gravity, Urine: 1.014 (ref 1.005–1.030)
pH: 5 (ref 5.0–8.0)

## 2021-04-30 LAB — I-STAT CHEM 8, ED
BUN: 108 mg/dL — ABNORMAL HIGH (ref 6–20)
Calcium, Ion: 0.84 mmol/L — CL (ref 1.15–1.40)
Chloride: 105 mmol/L (ref 98–111)
Creatinine, Ser: 16.6 mg/dL — ABNORMAL HIGH (ref 0.61–1.24)
Glucose, Bld: 205 mg/dL — ABNORMAL HIGH (ref 70–99)
HCT: 25 % — ABNORMAL LOW (ref 39.0–52.0)
Hemoglobin: 8.5 g/dL — ABNORMAL LOW (ref 13.0–17.0)
Potassium: 3.7 mmol/L (ref 3.5–5.1)
Sodium: 137 mmol/L (ref 135–145)
TCO2: 17 mmol/L — ABNORMAL LOW (ref 22–32)

## 2021-04-30 LAB — MAGNESIUM: Magnesium: 2.3 mg/dL (ref 1.7–2.4)

## 2021-04-30 LAB — I-STAT VENOUS BLOOD GAS, ED
Acid-base deficit: 10 mmol/L — ABNORMAL HIGH (ref 0.0–2.0)
Bicarbonate: 15.3 mmol/L — ABNORMAL LOW (ref 20.0–28.0)
Calcium, Ion: 0.85 mmol/L — CL (ref 1.15–1.40)
HCT: 24 % — ABNORMAL LOW (ref 39.0–52.0)
Hemoglobin: 8.2 g/dL — ABNORMAL LOW (ref 13.0–17.0)
O2 Saturation: 100 %
Potassium: 3.8 mmol/L (ref 3.5–5.1)
Sodium: 138 mmol/L (ref 135–145)
TCO2: 16 mmol/L — ABNORMAL LOW (ref 22–32)
pCO2, Ven: 30 mmHg — ABNORMAL LOW (ref 44.0–60.0)
pH, Ven: 7.316 (ref 7.250–7.430)
pO2, Ven: 210 mmHg — ABNORMAL HIGH (ref 32.0–45.0)

## 2021-04-30 LAB — LACTIC ACID, PLASMA
Lactic Acid, Venous: 4.4 mmol/L (ref 0.5–1.9)
Lactic Acid, Venous: 3.6 mmol/L (ref 0.5–1.9)
Lactic Acid, Venous: 1.6 mmol/L (ref 0.5–1.9)

## 2021-04-30 LAB — TROPONIN I (HIGH SENSITIVITY)
Troponin I (High Sensitivity): 66 ng/L — ABNORMAL HIGH (ref <18)
Troponin I (High Sensitivity): 167 ng/L (ref <18)

## 2021-04-30 LAB — PROTIME-INR
INR: 1.2 (ref 0.8–1.2)
Prothrombin Time: 15.4 seconds — ABNORMAL HIGH (ref 11.4–15.2)

## 2021-04-30 LAB — TYPE AND SCREEN
ABO/RH(D): O POS
Antibody Screen: NEGATIVE

## 2021-04-30 LAB — CBG MONITORING, ED: Glucose-Capillary: 170 mg/dL — ABNORMAL HIGH (ref 70–99)

## 2021-04-30 LAB — HIV ANTIBODY (ROUTINE TESTING W REFLEX): HIV Screen 4th Generation wRfx: NONREACTIVE

## 2021-04-30 LAB — TRIGLYCERIDES: Triglycerides: 104 mg/dL (ref <150)

## 2021-04-30 LAB — HEMOGLOBIN A1C
Hgb A1c MFr Bld: 6.7 % — ABNORMAL HIGH (ref 4.8–5.6)
Mean Plasma Glucose: 145.59 mg/dL

## 2021-04-30 LAB — CBC
HCT: 25 % — ABNORMAL LOW (ref 39.0–52.0)
Hemoglobin: 8.2 g/dL — ABNORMAL LOW (ref 13.0–17.0)
MCH: 31.1 pg (ref 26.0–34.0)
MCHC: 32.8 g/dL (ref 30.0–36.0)
MCV: 94.7 fL (ref 80.0–100.0)
Platelets: 175 10*3/uL (ref 150–400)
RBC: 2.64 MIL/uL — ABNORMAL LOW (ref 4.22–5.81)
RDW: 13.5 % (ref 11.5–15.5)
WBC: 15.7 10*3/uL — ABNORMAL HIGH (ref 4.0–10.5)
nRBC: 0 % (ref 0.0–0.2)

## 2021-04-30 LAB — SARS CORONAVIRUS 2 (TAT 6-24 HRS): SARS Coronavirus 2: NEGATIVE

## 2021-04-30 LAB — MRSA NEXT GEN BY PCR, NASAL: MRSA by PCR Next Gen: NOT DETECTED

## 2021-04-30 LAB — PROCALCITONIN: Procalcitonin: 0.29 ng/mL

## 2021-04-30 LAB — GLUCOSE, CAPILLARY: Glucose-Capillary: 153 mg/dL — ABNORMAL HIGH (ref 70–99)

## 2021-04-30 LAB — APTT: aPTT: 34 seconds (ref 24–36)

## 2021-04-30 LAB — PHOSPHORUS: Phosphorus: 9.5 mg/dL — ABNORMAL HIGH (ref 2.5–4.6)

## 2021-04-30 MED ORDER — ETOMIDATE 2 MG/ML IV SOLN
INTRAVENOUS | Status: AC | PRN
  Administered 2021-04-30: 30 mg via INTRAVENOUS

## 2021-04-30 MED ORDER — INSULIN ASPART 100 UNIT/ML IJ SOLN
0.0000 [IU] | INTRAMUSCULAR | Status: DC
Start: 2021-04-30 — End: 2021-05-04
  Administered 2021-04-30 (×2): 2 [IU] via SUBCUTANEOUS
  Administered 2021-05-01: 1 [IU] via SUBCUTANEOUS
  Administered 2021-05-01 – 2021-05-02 (×2): 2 [IU] via SUBCUTANEOUS
  Administered 2021-05-02: 1 [IU] via SUBCUTANEOUS
  Administered 2021-05-02: 2 [IU] via SUBCUTANEOUS
  Administered 2021-05-02: 1 [IU] via SUBCUTANEOUS
  Administered 2021-05-03: 2 [IU] via SUBCUTANEOUS
  Administered 2021-05-03 (×3): 3 [IU] via SUBCUTANEOUS
  Administered 2021-05-03 (×2): 2 [IU] via SUBCUTANEOUS
  Administered 2021-05-04 (×2): 3 [IU] via SUBCUTANEOUS

## 2021-04-30 MED ORDER — FENTANYL CITRATE (PF) 100 MCG/2ML IJ SOLN
INTRAMUSCULAR | Status: AC
  Administered 2021-04-30: 100 ug
  Filled 2021-04-30: qty 2

## 2021-04-30 MED ORDER — CALCIUM GLUCONATE-NACL 1-0.675 GM/50ML-% IV SOLN
1.0000 g | Freq: Once | INTRAVENOUS | Status: AC
  Administered 2021-04-30: 1000 mg via INTRAVENOUS
  Filled 2021-04-30: qty 50

## 2021-04-30 MED ORDER — SODIUM CHLORIDE 0.9 % IV SOLN: 250.0000 mL | INTRAVENOUS | Status: DC

## 2021-04-30 MED ORDER — PANTOPRAZOLE SODIUM 40 MG IV SOLR
40.0000 mg | Freq: Every day | INTRAVENOUS | Status: DC
  Administered 2021-05-01 – 2021-05-05 (×6): 40 mg via INTRAVENOUS
  Filled 2021-04-30 (×6): qty 40

## 2021-04-30 MED ORDER — FENTANYL CITRATE (PF) 100 MCG/2ML IJ SOLN
INTRAMUSCULAR | Status: AC
  Filled 2021-04-30: qty 2

## 2021-04-30 MED ORDER — CHLORHEXIDINE GLUCONATE CLOTH 2 % EX PADS
6.0000 | MEDICATED_PAD | Freq: Every day | CUTANEOUS | Status: DC
  Administered 2021-05-01 – 2021-05-06 (×6): 6 via TOPICAL

## 2021-04-30 MED ORDER — PROPOFOL 1000 MG/100ML IV EMUL
5.0000 ug/kg/min | INTRAVENOUS | Status: DC
  Administered 2021-04-30: 25 ug/kg/min via INTRAVENOUS

## 2021-04-30 MED ORDER — POLYETHYLENE GLYCOL 3350 17 G PO PACK
17.0000 g | PACK | Freq: Every day | ORAL | Status: DC
  Administered 2021-05-02 – 2021-05-06 (×3): 17 g
  Filled 2021-04-30 (×3): qty 1

## 2021-04-30 MED ORDER — ACETAMINOPHEN 325 MG PO TABS: 650.0000 mg | ORAL_TABLET | ORAL | Status: DC

## 2021-04-30 MED ORDER — HEPARIN SODIUM (PORCINE) 5000 UNIT/ML IJ SOLN
5000.0000 [IU] | Freq: Three times a day (TID) | INTRAMUSCULAR | Status: DC
  Administered 2021-04-30 – 2021-05-06 (×15): 5000 [IU] via SUBCUTANEOUS
  Filled 2021-04-30 (×16): qty 1

## 2021-04-30 MED ORDER — ACETAMINOPHEN 650 MG RE SUPP: 650.0000 mg | RECTAL | Status: DC

## 2021-04-30 MED ORDER — BUSPIRONE HCL 10 MG PO TABS: 30.0000 mg | ORAL_TABLET | Freq: Three times a day (TID) | ORAL | Status: DC

## 2021-04-30 MED ORDER — DOCUSATE SODIUM 50 MG/5ML PO LIQD
100.0000 mg | Freq: Two times a day (BID) | ORAL | Status: DC
  Administered 2021-05-02 – 2021-05-06 (×5): 100 mg
  Filled 2021-04-30 (×5): qty 10

## 2021-04-30 MED ORDER — PROPOFOL 1000 MG/100ML IV EMUL
5.0000 ug/kg/min | INTRAVENOUS | Status: DC
  Administered 2021-04-30: 5 ug/kg/min via INTRAVENOUS
  Administered 2021-05-01: 20 ug/kg/min via INTRAVENOUS
  Administered 2021-05-01: 10 ug/kg/min via INTRAVENOUS
  Administered 2021-05-01: 20 ug/kg/min via INTRAVENOUS
  Administered 2021-05-02 (×6): 40 ug/kg/min via INTRAVENOUS
  Administered 2021-05-03: 14 ug/kg/min via INTRAVENOUS
  Administered 2021-05-03: 30 ug/kg/min via INTRAVENOUS
  Administered 2021-05-03 (×3): 40 ug/kg/min via INTRAVENOUS
  Administered 2021-05-04: 14 ug/kg/min via INTRAVENOUS
  Administered 2021-05-05: 6 ug/kg/min via INTRAVENOUS
  Administered 2021-05-05 (×2): 15 ug/kg/min via INTRAVENOUS
  Filled 2021-04-30 (×21): qty 100

## 2021-04-30 MED ORDER — CHLORHEXIDINE GLUCONATE 0.12% ORAL RINSE (MEDLINE KIT)
15.0000 mL | Freq: Two times a day (BID) | OROMUCOSAL | Status: DC
  Administered 2021-04-30 – 2021-05-06 (×12): 15 mL via OROMUCOSAL

## 2021-04-30 MED ORDER — CALCITRIOL 0.5 MCG PO CAPS
0.5000 ug | ORAL_CAPSULE | ORAL | Status: DC
  Administered 2021-04-30: 0.5 ug via ORAL
  Filled 2021-04-30: qty 1

## 2021-04-30 MED ORDER — NOREPINEPHRINE 4 MG/250ML-% IV SOLN
2.0000 ug/min | INTRAVENOUS | Status: DC
Start: 2021-04-30 — End: 2021-05-02
  Administered 2021-04-30 – 2021-05-02 (×2): 2 ug/min via INTRAVENOUS

## 2021-04-30 MED ORDER — ORAL CARE MOUTH RINSE
15.0000 mL | OROMUCOSAL | Status: DC
  Administered 2021-04-30 – 2021-05-06 (×55): 15 mL via OROMUCOSAL

## 2021-04-30 MED ORDER — NOREPINEPHRINE 4 MG/250ML-% IV SOLN
2.0000 ug/min | INTRAVENOUS | Status: DC
Start: 2021-04-30 — End: 2021-04-30

## 2021-04-30 MED ORDER — FENTANYL CITRATE PF 50 MCG/ML IJ SOSY: 25.0000 ug | PREFILLED_SYRINGE | INTRAMUSCULAR | Status: DC | PRN

## 2021-04-30 MED ORDER — ACETAMINOPHEN 160 MG/5ML PO SOLN
650.0000 mg | ORAL | Status: DC
  Administered 2021-05-01 – 2021-05-06 (×31): 650 mg
  Filled 2021-04-30 (×31): qty 20.3

## 2021-04-30 MED ORDER — LABETALOL HCL 5 MG/ML IV SOLN
10.0000 mg | INTRAVENOUS | Status: DC | PRN
  Administered 2021-04-30: 10 mg via INTRAVENOUS
  Filled 2021-04-30 (×2): qty 4

## 2021-04-30 MED ORDER — CALCITRIOL 1 MCG/ML PO SOLN
0.5000 ug | Freq: Every day | ORAL | Status: DC
  Administered 2021-05-01 – 2021-05-06 (×7): 0.5 ug
  Filled 2021-04-30 (×7): qty 0.5

## 2021-04-30 MED ORDER — ROCURONIUM BROMIDE 50 MG/5ML IV SOLN
INTRAVENOUS | Status: AC | PRN
  Administered 2021-04-30: 100 mg via INTRAVENOUS

## 2021-04-30 MED ORDER — FENTANYL CITRATE (PF) 100 MCG/2ML IJ SOLN
25.0000 ug | INTRAMUSCULAR | Status: DC | PRN
Start: 2021-04-30 — End: 2021-05-07
  Administered 2021-04-30 (×2): 100 ug via INTRAVENOUS
  Administered 2021-04-30: 50 ug via INTRAVENOUS
  Administered 2021-04-30: 100 ug via INTRAVENOUS
  Administered 2021-04-30: 50 ug via INTRAVENOUS
  Administered 2021-05-01 (×6): 100 ug via INTRAVENOUS
  Administered 2021-05-04 – 2021-05-06 (×3): 50 ug via INTRAVENOUS
  Filled 2021-04-30 (×13): qty 2

## 2021-04-30 MED ORDER — ONDANSETRON HCL 4 MG/2ML IJ SOLN: 4.0000 mg | Freq: Four times a day (QID) | INTRAMUSCULAR | Status: DC | PRN

## 2021-04-30 MED ORDER — HEPARIN SODIUM (PORCINE) 1000 UNIT/ML DIALYSIS: 3000.0000 [IU] | INTRAMUSCULAR | Status: DC | PRN

## 2021-04-30 MED ORDER — LACTATED RINGERS IV BOLUS
1000.0000 mL | Freq: Once | INTRAVENOUS | Status: AC
  Administered 2021-04-30: 1000 mL via INTRAVENOUS

## 2021-04-30 NOTE — Progress Notes (Signed)
[facility] Progress Note Patient Name: Danny Patrick. DOB: July 03, 1961 MRN: [id number]   Date of Service  04/15/2021  HPI/Events of Note  Camera eval done. Discussed with RN. On Vent, in synchrony. SBP went down from 180 to 60, post fenta IV. On propofol. RN started levo at 4 via PIV. Art line SBP back up to 180 now. Getting ready for HD-ESRD.  S/p witnessed cardiac arrest.   eICU Interventions  - Levo via PIV ordered. Wean off. Use if BP drops during HD.      Intervention Category Major Interventions: Hypotension - evaluation and management  Elmer Sow 04/17/2021, 8:42 PM

## 2021-04-30 NOTE — H&P (Addendum)
NAME:  Danny Pribble., MRN:  [id number], DOB:  22-Mar-1961, LOS: 0 ADMISSION DATE:  04/22/2021, CONSULTATION DATE:  05/08/2021 REFERRING MD:  Dr. Tyrone Nine, CHIEF COMPLAINT:  Cardiac Arrest    History of Present Illness:  60 y/o M who presented to [facility] ER on 8/22 via EMS post cardiac arrest.   The patient recently moved to [facility] in the last two months.  He returned back to [facility] to go to kidney transplant classes on Thursday 04/26/21.  He reported he had been more short of breath recently with activity.  Family noted he was having to pick his legs up to get them into the car.  He missed HD on Friday 8/19 after traveling.  Family reports they were at home am of presentation.  The son left to go to the restroom for a couple of minutes and when he returned he noted the patient to have snoring respirations (pt snores when he sleeps per son and recognized the abnormal sounds). Family called 911 & initiated CPR.  Initial rhythm for EMS was PEA with a rate of 30.  He was given EPI x3.  His HR became elevated after EPI and patient was cardioverted per EMS.  He was bagged into the ER and intubated on arrival.  Received 30 mg etomidate, 100 mg rocuronium for intubation. Initial VBG 7.316, CO2 30.  ISTAT labs - Na 137, K 3.7, glucose 205, BUN 108, CR 16.6, ionized calcium 0.84, WBC 15.7, Hgb 8.2.  CXR showed a right mainstem intubation.  He had spontaneous respirations and was started on propofol.  Unfortunately, he suffered an additional arrest in the ER requiring ACLS x1 round.  He was treated with LR x1 L, & pan cultured.  UDS negative. UA with elevated glucose, negative ketones, protein >300, rare bacteria.  Post arrest ABG 7.134 / 54.3 / 128 / 18.2.  CXR showed right mainstem intubation (adjusted in ER), cardiomegaly and pulmonary edema.    Friend reports he does not normally have regular bowel movements.  Noted he had difficulty buttoning his pants on 8/21. He had COVID in 2020 and has been vaccinated.   PCCM called  for ICU admission.   Pertinent  Medical History  ESRD - AVF placed 09/13/19 DM - poorly controlled  Diastolic CHF  HTN Obesity  Anemia  Murmur   Significant Hospital Events: Including procedures, antibiotic start and stop dates in addition to other pertinent events   8/22 Admit post out of hospital cardiac arrest, arrested a second time in the ER  Interim History / Subjective:  On vent - family at bedside   Objective   Blood pressure 131/68, pulse 78, resp. rate (!) 22, height '5\' 9"'$  (1.753 m), weight 120 kg, SpO2 100 %.    Vent Mode: PRVC FiO2 (%):  [100 %] 100 % Set Rate:  [14 bmp-18 bmp] 18 bmp Vt Set:  [560 mL] 560 mL PEEP:  [5 cmH20] 5 cmH20 Plateau Pressure:  [23 cmH20-24 cmH20] 23 cmH20  No intake or output data in the 24 hours ending 04/17/2021 1440 Filed Weights   04/26/2021 1000  Weight: 120 kg    Examination: General: critically ill appearing adult male lying on ER stretcher on vent in NAD HENT: ETT, left pupil 2-39m, right eye erythematous / post surgical changes noted Lungs: non-labored on vent, lungs bilaterally coarse, no cough/gag Cardiovascular: s1s2 RRR, SR on monitor Abdomen: protuberant, soft, bsx4 hypoactive Extremities: warm, dry skin, no significant edema in LE's Neuro: no response to  voice, no withdrawal to pain, no corneal response in left eye (unable to test right due to surgical changes), myoclonic jerking noted   Resolved Hospital Problem list      Assessment & Plan:   Out of Hospital Cardiac Arrest  Initial rhythm PEA per EMS.  Second arrest in ER requiring additional CPR. Troponin flat. Suspicion for volume overload with missed HD leading to respiratory > cardiac arrest.  EKG negative. Estimated downtime 15-20 minutes total. PCT 0.2, doubt infectious etiology of arrest - mild elevation in renal failure expected.  -admit to ICU for normothermia protocol  -avoid fever, hyperglycemia, hypotension -will need HD before any discussion regarding  neuro prognostication  -may need Neurology at some point to assist with prognostication  Acute Metabolic Encephalopathy  UDS negative. Hypercarbia noted on ABG.  At risk hypoxic injury given arrest.  -Fentanyl for pain -hold sedation to allow for neuro exam  -second arrest after propofol with SBP in the 40's   -follow frequent neuro exam  -assess EEG -CT head now, heparin scheduled for 10pm for DVT prophylaxis, pending results may need to discontinue   Acute Hypoxic, Hypercarbic Respiratory Failure  In setting of volume overload, post arrest  -PRVC 8cc/kg, rate 20  -repeat ABG now  -wean PEEP / fiO2 for sats >90% -follow intermittent CXR, note was adjusted in ER post intubation for R mainstem intubation  -ABG in am   ESRD HD M/W/F.  Has been on HD since 2019.  Was back in [place] to attend "kidney classes" for possible transplant -Nephrology consult for HD  -Trend BMP / urinary output -Replace electrolytes as indicated, calcium as below   Hypocalcemia  -1gm calcium gluconate now  Elevated LFT's  Suspect in setting of post arrest  -follow trent   Abdominal Distention  -assess KUB   DM -SSI, sensitive scale -assess Hgb A1c  HTN -hold home agents -120-150 SBP currently, if significantly elevated consider PRN agent. Will hold for now given immediate post arrest.   Anemia  Suspect chronic in setting of renal disease -trend CBC  Best Practice (right click and "Reselect all SmartList Selections" daily)  Diet/type: NPO DVT prophylaxis: prophylactic heparin  GI prophylaxis: PPI Lines: Arterial Line Foley:  N/A Code Status:  full code Last date of multidisciplinary goals of care discussion - Son and friend updated at bedside 8/22 on plan of care.  Mellody Dance [phone] Son Keyvion Eschete [phone]  Labs   CBC: Recent Labs  Lab 05/08/2021 1039 04/20/2021 1040 05/04/2021 1229  WBC 15.7*  --   --   HGB 8.2* 8.5*  8.2* 9.5*  HCT 25.0* 25.0*  24.0*  28.0*  MCV 94.7  --   --   PLT 175  --   --     Basic Metabolic Panel: Recent Labs  Lab 04/23/2021 1039 04/22/2021 1040 04/29/2021 1229  NA 139 137  138 140  K 3.9 3.7  3.8 3.6  CL 104 105  --   CO2 14*  --   --   GLUCOSE 207* 205*  --   BUN 98* 108*  --   CREATININE 15.02* 16.60*  --   CALCIUM 7.2*  --   --   MG 2.3  --   --   PHOS 9.5*  --   --    GFR: Estimated Creatinine Clearance: 6.2 mL/min (A) (by C-G formula based on SCr of 16.6 mg/dL (H)). Recent Labs  Lab 05/02/2021 1039  PROCALCITON 0.29  WBC 15.7*  LATICACIDVEN 4.4*    Liver Function Tests: Recent Labs  Lab 04/28/2021 1039  AST 72*  ALT 47*  ALKPHOS 88  BILITOT 1.0  PROT 6.5  ALBUMIN 3.2*   No results for input(s): LIPASE, AMYLASE in the last 168 hours. No results for input(s): AMMONIA in the last 168 hours.  ABG    Component Value Date/Time   PHART 7.134 (LL) 04/13/2021 1229   PCO2ART 54.3 (H) 04/19/2021 1229   PO2ART 128 (H) 05/08/2021 1229   HCO3 18.2 (L) 05/03/2021 1229   TCO2 20 (L) 04/13/2021 1229   ACIDBASEDEF 11.0 (H) 04/12/2021 1229   O2SAT 98.0 04/16/2021 1229     Coagulation Profile: Recent Labs  Lab 04/18/2021 1039  INR 1.2    Cardiac Enzymes: No results for input(s): CKTOTAL, CKMB, CKMBINDEX, TROPONINI in the last 168 hours.  HbA1C: No results found for: HGBA1C  CBG: Recent Labs  Lab 04/10/2021 1413  GLUCAP 170*    Review of Systems:   Unable to complete as patient is altered on mechanical ventilation.   Past Medical History:  He,  has a past medical history of Anemia, Cardiac murmur, DM (diabetes mellitus) ([place]), ESRD (end stage renal disease) ([place]), HTN (hypertension), and Obesity.   Surgical History:   Past Surgical History:  Procedure Laterality Date   AV FISTULA PLACEMENT  09/13/2019     Social History:      Family History:  His family history is not on file.   Allergies Allergies  Allergen Reactions   Chocolate Other (See Comments)    migraines    Cocoa Other (See Comments)    migraines   Insulin Aspart Swelling   Iron Sucrose Nausea And Vomiting   Lisinopril Cough   Other Swelling    Reaction to artificial sweeteners - splenda, equal   Penicillins Swelling   Vancomycin Other (See Comments)    Acute renal insuff. Inpatient 2015     Home Medications  Prior to Admission medications   Not on File     Critical care time: 40 minutes     Danny Gens, MSN, APRN, NP-C, AGACNP-BC [facility] 04/27/2021, 2:40 PM   Please see [email or web address] for pager details.   From 7A-7P if no response, please call [phone] After hours, please call ELink [phone]

## 2021-04-30 NOTE — Progress Notes (Signed)
  Echocardiogram 2D Echocardiogram has been performed.  Danny Patrick 04/25/2021, 4:33 PM

## 2021-04-30 NOTE — Progress Notes (Signed)
RT note- Patient brought from the ED, currently on EEG with moderate biting on the ETT . Attempted to place a bite block unable, sedation given by RN, ventilator mechanics better a this time as well. Continue to monitor.

## 2021-04-30 NOTE — ED Provider Notes (Signed)
[facility] EMERGENCY DEPARTMENT Provider Note   CSN: [id number] Arrival date & time: 04/09/2021  1030     History Chief Complaint  Patient presents with   Cardiac Arrest    Danny Patrick. is a 60 y.o. male.  HPI    60 yo M with a PMHx of anemia, DM, HTN, ESRD on HD presenting to the ED via EMS following a cardiac arrest.  EMS report that the patient had a witnessed arrest by family. On arrival, the patient was in PEA. He was given code dose epi x3 and his pul es returned. His heart rate was 250 and he was cardioverted.  Oxygenated and ventilated through a an LMA. Patient was initiating spontaneous respirations per EMS.  However his breathing was slow and agonal, so they assisted with an LMA.  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   Past Medical History:  Diagnosis Date   Anemia    Cardiac murmur    DM (diabetes mellitus) ([place])    ESRD (end stage renal disease) ([place])    HTN (hypertension)    Obesity     There are no problems to display for this patient.  No family history on file.     Home Medications Prior to Admission medications   Medication Sig Start Date End Date Taking? Authorizing Provider  acetaminophen (TYLENOL) 500 MG tablet Take 500-1,000 mg by mouth every 6 (six) hours as needed for headache (pain).   Yes [provider]  lidocaine-prilocaine (EMLA) cream Apply 1 application topically daily as needed (prior to port access). 03/02/21  Yes [provider]  naproxen sodium (ALEVE) 220 MG tablet Take 220 mg by mouth 2 (two) times daily as needed (pain/headache).   Yes [provider]  PRESCRIPTION MEDICATION Take 1 tablet by mouth. Blood pressure medication   Yes [provider]  sevelamer carbonate (RENVELA) 800 MG tablet Take 800 mg by mouth 3 (three) times daily with meals. 04/10/21  Yes [provider]    Allergies    Chocolate, Cocoa, Insulin aspart, Iron sucrose, Lisinopril, Other,  Penicillins, and Vancomycin  Review of Systems   Review of Systems  Unable to perform ROS: Patient unresponsive   Physical Exam Updated Vital Signs BP 122/60   Pulse 80   Resp (!) 23   Ht '5\' 9"'$  (1.753 m)   Wt 120 kg   SpO2 100%   BMI 39.07 kg/m   Physical Exam Vitals and nursing note reviewed.  Constitutional:      Appearance: He is well-developed. He is obese.     Comments: LMA in place, not responsive  HENT:     Head: Normocephalic and atraumatic.     Mouth/Throat:     Pharynx: No oropharyngeal exudate or posterior oropharyngeal erythema.     Comments: LMA in place Eyes:     General: No scleral icterus. Cardiovascular:     Rate and Rhythm: Normal rate and regular rhythm.  Pulmonary:     Breath sounds: Normal breath sounds.     Comments: LMA in place, equal chest rise Abdominal:     Palpations: Abdomen is soft. There is no mass.     Hernia: No hernia is present.  Musculoskeletal:        General: No deformity or signs of injury.     Cervical back: Neck supple. No rigidity.  Skin:    General: Skin is warm and dry.     Capillary Refill: Capillary refill takes less  than 2 seconds.  Neurological:     Comments: Not responsive    ED Results / Procedures / Treatments   Labs (all labs ordered are listed, but only abnormal results are displayed) Labs Reviewed  COMPREHENSIVE METABOLIC PANEL - Abnormal; Notable for the following components:      Result Value   CO2 14 (*)    Glucose, Bld 207 (*)    BUN 98 (*)    Creatinine, Ser 15.02 (*)    Calcium 7.2 (*)    Albumin 3.2 (*)    AST 72 (*)    ALT 47 (*)    GFR, Estimated 3 (*)    Anion gap 21 (*)    All other components within normal limits  LACTIC ACID, PLASMA - Abnormal; Notable for the following components:   Lactic Acid, Venous 4.4 (*)    All other components within normal limits  CBC - Abnormal; Notable for the following components:   WBC 15.7 (*)    RBC 2.64 (*)    Hemoglobin 8.2 (*)    HCT 25.0 (*)     All other components within normal limits  PROTIME-INR - Abnormal; Notable for the following components:   Prothrombin Time 15.4 (*)    All other components within normal limits  PHOSPHORUS - Abnormal; Notable for the following components:   Phosphorus 9.5 (*)    All other components within normal limits  URINALYSIS, ROUTINE W REFLEX MICROSCOPIC - Abnormal; Notable for the following components:   APPearance CLOUDY (*)    Glucose, UA 150 (*)    Hgb urine dipstick MODERATE (*)    Protein, ur >=300 (*)    Bacteria, UA RARE (*)    All other components within normal limits  I-STAT CHEM 8, ED - Abnormal; Notable for the following components:   BUN 108 (*)    Creatinine, Ser 16.60 (*)    Glucose, Bld 205 (*)    Calcium, Ion 0.84 (*)    TCO2 17 (*)    Hemoglobin 8.5 (*)    HCT 25.0 (*)    All other components within normal limits  I-STAT VENOUS BLOOD GAS, ED - Abnormal; Notable for the following components:   pCO2, Ven 30.0 (*)    pO2, Ven 210.0 (*)    Bicarbonate 15.3 (*)    TCO2 16 (*)    Acid-base deficit 10.0 (*)    Calcium, Ion 0.85 (*)    HCT 24.0 (*)    Hemoglobin 8.2 (*)    All other components within normal limits  I-STAT ARTERIAL BLOOD GAS, ED - Abnormal; Notable for the following components:   pH, Arterial 7.134 (*)    pCO2 arterial 54.3 (*)    pO2, Arterial 128 (*)    Bicarbonate 18.2 (*)    TCO2 20 (*)    Acid-base deficit 11.0 (*)    Calcium, Ion 0.97 (*)    HCT 28.0 (*)    Hemoglobin 9.5 (*)    All other components within normal limits  TROPONIN I (HIGH SENSITIVITY) - Abnormal; Notable for the following components:   Troponin I (High Sensitivity) 66 (*)    All other components within normal limits  CULTURE, BLOOD (ROUTINE X 2)  CULTURE, BLOOD (ROUTINE X 2)  URINE CULTURE  CULTURE, RESPIRATORY W GRAM STAIN  SARS CORONAVIRUS 2 (TAT 6-24 HRS)  APTT  MAGNESIUM  RAPID URINE DRUG SCREEN, HOSP PERFORMED  PROCALCITONIN  TRIGLYCERIDES  BLOOD GAS, ARTERIAL   LACTIC ACID, PLASMA  LACTIC  ACID, PLASMA  LACTIC ACID, PLASMA  CBG MONITORING, ED  CBG MONITORING, ED  TYPE AND SCREEN  ABO/RH  TROPONIN I (HIGH SENSITIVITY)    EKG EKG Interpretation  Date/Time:  Monday April 30 2021 10:31:43 EDT Ventricular Rate:  100 PR Interval:  177 QRS Duration: 95 QT Interval:  379 QTC Calculation: 489 R Axis:   -4 Text Interpretation: Sinus tachycardia Probable left atrial enlargement RSR' in V1 or V2, probably normal variant Nonspecific T abnormalities, lateral leads Borderline prolonged QT interval No old tracing to compare Confirmed by Deno Etienne [phone]) on 04/24/2021 11:14:57 AM  Radiology DG Chest Portable 1 View  Result Date: 04/29/2021 CLINICAL DATA:  CPR.  Endotracheal placement. EXAM: PORTABLE CHEST 1 VIEW COMPARISON:  None. FINDINGS: Endotracheal tube is in the proximal right mainstem bronchus. Withdraw 2 cm to be above the carina. Orogastric or nasogastric tube enters the abdomen. Enlarged cardiac silhouette. Pulmonary edema. No collapse or effusion. No acute bone finding. IMPRESSION: Endotracheal tube in the proximal right mainstem bronchus. Withdraw at least 2 cm to be above the carina. Cardiomegaly and pulmonary edema. Electronically Signed   By: Nelson Chimes M.D.   On: 05/04/2021 11:17    Procedures Procedure Name: Intubation Date/Time: 05/08/2021 1:27 PM Performed by: Claud Kelp, MD Pre-anesthesia Checklist: Patient identified, Emergency Drugs available, Patient being monitored, Suction available and Timeout performed Preoxygenation: Pre-oxygenation with 100% oxygen Induction Type: Rapid sequence Laryngoscope Size: Glidescope and 3 Grade View: Grade II Tube size: 7.5 mm Number of attempts: 1 Airway Equipment and Method: Video-laryngoscopy and Rigid stylet Placement Confirmation: CO2 detector, Breath sounds checked- equal and bilateral and ETT inserted through vocal cords under direct vision Secured at: 28 (Retracted to 26 cm after  CXR) cm Tube secured with: ETT holder    ARTERIAL LINE  Date/Time: 05/09/2021 1:28 PM Performed by: Claud Kelp, MD Authorized by: Deno Etienne, DO   Consent:    Consent obtained:  Emergent situation Universal protocol:    Immediately prior to procedure, a time out was called: yes     Patient identity confirmed:  Arm band Indications:    Indications: hemodynamic monitoring and multiple ABGs   Pre-procedure details:    Skin preparation:  Chlorhexidine   Preparation: Patient was prepped and draped in sterile fashion   Sedation:    Sedation type:  Deep Procedure details:    Location:  R radial   Needle gauge:  18 G   Placement technique:  Seldinger and ultrasound guided   Number of attempts:  2 Post-procedure details:    Post-procedure:  Secured with tape, sterile dressing applied and biopatch applied   Procedure completion:  Tolerated well, no immediate complications   Medications Ordered in ED Medications  propofol (DIPRIVAN) 1000 MG/100ML infusion (0 mcg/kg/min  120 kg Intravenous Stopped 04/13/2021 1126)  lactated ringers bolus 1,000 mL (has no administration in time range)  etomidate (AMIDATE) injection (30 mg Intravenous Given 05/04/2021 1033)  rocuronium (ZEMURON) injection (100 mg Intravenous Given 04/16/2021 1034)    ED Course  I have reviewed the triage vital signs and the nursing notes.  Pertinent labs & imaging results that were available during my care of the patient were reviewed by me and considered in my medical decision making (see chart for details).    MDM Rules/Calculators/A&P                           60 year old male with ESRD on HD presenting to the  emergency department following a cardiac arrest status post return of spontaneous circulation. The patient was hemodynamically stable on arrival.  LMA was in place.  This is exchanged for an ET tube as above following RSI without any incident.  Sedated on propofol.  Chest x-ray showed that the tube was in the  right mainstream, therefore it was retracted with improvement in bilateral aeration.  Normal oxygen saturations, equal chest rise following intubation.  Blood gas sent with a normal potassium.  Exact etiology of his arrest currently unknown. EKG has no ST elevations.  We will send a CBC, CMP, lactic acid, coags. No reports of trauma that would warrant broad imaging.  No history of pulmonary embolism. Will need admission to the ICU, crit care contacted.   I was called to the room because the patient's blood pressure dropped to a MAP of 48. On evaluating the patient, he had no pulse.  1 round of compressions and epi push x1, return of spontaneous circulation.  Patient was markedly hypertensive following the epinephrine, which gradually came down without any intervention.  Arterial line placed as above for closer hemodynamic monitoring.  Initial ABG following arrest shows a pH of 7.134, PCO2 54.  Bicarb of 18.  No leukocytosis.  Metabolic panel shows a decreased bicarb of 14, elevated BUN of essentially 100 with expected creatinine given his ESRD status.  No hyperkalemia.  Mildly elevated transaminases.  EKG without ST elevations.  Troponins are flat.  Overall, does not seem infectious.  He has a mild acidosis that is also less likely to be the cause of his arrest.  He does seem significantly volume overloaded, given his ESRD status this could have led to his arrest.  Handoff given to the ICU team, they have placed orders on the patient.  Patient will be admitted to ICU.  Final Clinical Impression(s) / ED Diagnoses Final diagnoses:  Cardiac arrest [facility])    Rx / [place] Orders ED Discharge Orders     None        Claud Kelp, MD 04/19/2021 [place], Mc[place]ts, DO 05/01/21 [phone]

## 2021-04-30 NOTE — ED Notes (Addendum)
Patient presents to ed via [facility] state patient had a witness cardiac arrest by family uponm ems arriva pateitn was in PEA rate of 30 had epip x 3 ratges was 250 cardiverted rates 100 SR, IO left tib/fib. Attempting to breath over bagging. Dr Tyrone Nine at bedside. Left arm restricted.

## 2021-04-30 NOTE — Progress Notes (Addendum)
LTM maint complete - no skin breakdown under:  Fp1 Fp2 F3 [facility] monitored, Event button test confirmed by [facility].  

## 2021-04-30 NOTE — ED Notes (Signed)
Family at bedside. 

## 2021-04-30 NOTE — Progress Notes (Signed)
Started cEEG study.  Notified [facility] monitoring.  Tested patient event button. 

## 2021-04-30 NOTE — ED Notes (Signed)
Propofol drip off b/p 48/unable to feel pusle, Cpr started Dr. Tyrone Nine at bedside.  1127 Epip x 1 Cpr continued  1129 pulse check femoral  Pulses present. Return rate 120. B/p 254/106

## 2021-04-30 NOTE — Progress Notes (Addendum)
EEG complete - results pending 

## 2021-04-30 NOTE — ED Notes (Signed)
RN and RT transported pt to CT

## 2021-04-30 NOTE — Consult Note (Signed)
Renal Service Consult Note [facility]  Topher Shirer. 05/02/2021 Sol Blazing, MD Requesting Physician: Dr Shearon Stalls  Reason for Consult: ESRD pt w/ cardiac arrest HPI: The patient is a 60 y.o. year-old w/ hx of DM2, HTN, obesity, ESRD on HD presented to ED following a cardiac arrest that occurred at home.  Downtime was 15-20 min, PEA arrest on arrival. He has home HD unit in [place]. He had plans to come up for transplant screening on 8/23.  He was supposed to leave on 05-10-2023 but his father passed away, according to the family, so he came up early to for the funeral and missed his Friday HD in [place]. He was supposed to start temporary HD here in [place] today. He was SOB over the weekend and this am had arrest as above. Asked to see for ESRD.    Pt's family at the bedside and provided hx.  On HD since 2019. Gets HD at [facility] in [place], [place] MWF.  L arm fistula.     ROS - n/a   Past Medical History  Past Medical History:  Diagnosis Date   Anemia    Cardiac murmur    DM (diabetes mellitus) ([place])    ESRD (end stage renal disease) ([place])    HTN (hypertension)    Obesity    Past Surgical History  Past Surgical History:  Procedure Laterality Date   AV FISTULA PLACEMENT  09/13/2019   Family History No family history on file. Social History  has no history on file for tobacco use, alcohol use, and drug use. Allergies  Allergies  Allergen Reactions   Chocolate Other (See Comments)    migraines   Cocoa Other (See Comments)    migraines   Insulin Aspart Swelling   Iron Sucrose Nausea And Vomiting   Lisinopril Cough   Other Swelling    Reaction to artificial sweeteners - splenda, equal   Penicillins Swelling   Vancomycin Other (See Comments)    Acute renal insuff. Inpatient 2015   Home medications Prior to Admission medications   Medication Sig Start Date End Date Taking? Authorizing Provider  acetaminophen (TYLENOL) 500 MG tablet Take 500-1,000 mg by mouth  every 6 (six) hours as needed for headache (pain).   Yes [provider]  lidocaine-prilocaine (EMLA) cream Apply 1 application topically daily as needed (prior to port access). 03/02/21  Yes [provider]  naproxen sodium (ALEVE) 220 MG tablet Take 220 mg by mouth 2 (two) times daily as needed (pain/headache).   Yes [provider]  PRESCRIPTION MEDICATION Take 1 tablet by mouth. Blood pressure medication   Yes [provider]  sevelamer carbonate (RENVELA) 800 MG tablet Take 800 mg by mouth 3 (three) times daily with meals. 04/10/21  Yes [provider]     Vitals:   05/02/2021 1550 05/09/2021 1555 04/11/2021 1600 04/23/2021 1615  BP: (!) 161/77  115/68 113/69  Pulse: 79  64 66  Resp: (!) 26  16 (!) 28  Temp:  (!) 97.3 F (36.3 C)    TempSrc:  Core    SpO2: 100%  100% 99%  Weight:      Height:       Exam Gen on vent, sedated No rash, cyanosis or gangrene Sclera anicteric, throat w/ ETT No jvd or bruits Chest clear anterior/ lateral RRR no MRG Abd obese, soft, no ascites  GU normal male MS no joint effusions or deformity Ext no LE or UE edema, no  wounds or ulcers Neuro is on vent, sedated L FA AVF+bruit     Home meds include renvela 800 tid, prns   CXR IMPRESSION: Endotracheal tube in the proximal right mainstem bronchus. Withdraw at least 2 cm to be above the carina. Cardiomegaly and pulmonary edema.     OP HD: transient at [facility] MWF - home unit is [facility] in [place], [place]    [phone])   4h  106.5kg   Heparin 25u/ kg   LFA AVF  - rocaltrol 0.5 ug tiw   Assessment/ Plan: Cardiac arrest - downtime 15-20 minutes, on vent Acute resp failure - CXR showing pulm edema, missed last Dec 06, 2022 HD due to death in the family. On vent and sedated per CCM.  ESRD - on HD in [place] MWF using L arm AVF. Started in 2019. Last HD was 8/17. Last week. Plan HD later this evening.   BP / volume - not sure if on BP lowering meds at home. BP here is  stable post arrest. On vent and sedated.  Max UF w/ HD tonight as tolerated. Anemia ckd - Hb 9's here, follow MBD ckd - cont rocaltrol tiw, binder when eating HTN - BP's wnl here post arrest Volume - presumably vol overloaded due to missed HD. Not edematous on exam. CXR +diffuse infiltrates/ edema.       Kelly Splinter  MD 04/19/2021, 4:28 PM  Recent Labs  Lab 05/02/2021 1039 04/20/2021 1040 04/18/2021 1229 04/28/2021 1516  WBC 15.7*  --   --   --   HGB 8.2*   < > 9.5* 9.5*   < > = values in this interval not displayed.   Recent Labs  Lab 04/25/2021 1039 04/26/2021 1040 04/25/2021 1229 04/25/2021 1516  K 3.9 3.7  3.8 3.6 4.5  BUN 98* 108*  --   --   CREATININE 15.02* 16.60*  --   --   CALCIUM 7.2*  --   --   --   PHOS 9.5*  --   --   --

## 2021-04-30 NOTE — Progress Notes (Signed)
[facility] Progress Note Patient Name: Danny Patrick. DOB: 10-28-1960 MRN: [id number]   Date of Service  05/03/2021  HPI/Events of Note  To do : KUB- no ileus or obstructive findings CTH neg ABP reviewed.  S/P PEA arrest  eICU Interventions  Continue care     Intervention Category Intermediate Interventions: Diagnostic test evaluation  Elmer Sow 04/19/2021, 7:07 PM

## 2021-04-30 NOTE — Progress Notes (Addendum)
Called to bedside by RN for abnormal motor movements - pt assessed at bedside, noted to have sucking reflex on exam, intermittent myoclonic jerking with hypertension.  Exam worrisome for anoxic injury.  EEG in progress.   Plan: Add low dose propofol  Monitor hemodynamics closely  Await CT head findings  Pending HD this evening     Danny Gens, MSN, APRN, NP-C, AGACNP-BC [facility] 05/04/2021, 6:38 PM   Please see [email or web address] for pager details.   From 7A-7P if no response, please call [phone] After hours, please call ELink [phone]

## 2021-05-01 ENCOUNTER — Inpatient Hospital Stay (HOSPITAL_COMMUNITY): Payer: Medicare Other

## 2021-05-01 DIAGNOSIS — G9341 Metabolic encephalopathy: Secondary | ICD-10-CM | POA: Diagnosis not present

## 2021-05-01 DIAGNOSIS — I469 Cardiac arrest, cause unspecified: Secondary | ICD-10-CM | POA: Diagnosis not present

## 2021-05-01 DIAGNOSIS — R569 Unspecified convulsions: Secondary | ICD-10-CM

## 2021-05-01 DIAGNOSIS — N186 End stage renal disease: Secondary | ICD-10-CM | POA: Diagnosis not present

## 2021-05-01 DIAGNOSIS — J9601 Acute respiratory failure with hypoxia: Secondary | ICD-10-CM | POA: Diagnosis not present

## 2021-05-01 DIAGNOSIS — G40409 Other generalized epilepsy and epileptic syndromes, not intractable, without status epilepticus: Secondary | ICD-10-CM

## 2021-05-01 LAB — GLUCOSE, CAPILLARY
Glucose-Capillary: 105 mg/dL — ABNORMAL HIGH (ref 70–99)
Glucose-Capillary: 108 mg/dL — ABNORMAL HIGH (ref 70–99)
Glucose-Capillary: 119 mg/dL — ABNORMAL HIGH (ref 70–99)
Glucose-Capillary: 141 mg/dL — ABNORMAL HIGH (ref 70–99)
Glucose-Capillary: 157 mg/dL — ABNORMAL HIGH (ref 70–99)
Glucose-Capillary: 95 mg/dL (ref 70–99)
Glucose-Capillary: 96 mg/dL (ref 70–99)

## 2021-05-01 LAB — PHOSPHORUS
Phosphorus: 6.5 mg/dL — ABNORMAL HIGH (ref 2.5–4.6)
Phosphorus: 7.5 mg/dL — ABNORMAL HIGH (ref 2.5–4.6)

## 2021-05-01 LAB — COMPREHENSIVE METABOLIC PANEL
ALT: 55 U/L — ABNORMAL HIGH (ref 0–44)
AST: 56 U/L — ABNORMAL HIGH (ref 15–41)
Albumin: 3.7 g/dL (ref 3.5–5.0)
Alkaline Phosphatase: 99 U/L (ref 38–126)
Anion gap: 14 (ref 5–15)
BUN: 38 mg/dL — ABNORMAL HIGH (ref 6–20)
CO2: 23 mmol/L (ref 22–32)
Calcium: 8.6 mg/dL — ABNORMAL LOW (ref 8.9–10.3)
Chloride: 98 mmol/L (ref 98–111)
Creatinine, Ser: 6.72 mg/dL — ABNORMAL HIGH (ref 0.61–1.24)
GFR, Estimated: 9 mL/min — ABNORMAL LOW (ref >60)
Glucose, Bld: 96 mg/dL (ref 70–99)
Potassium: 3.4 mmol/L — ABNORMAL LOW (ref 3.5–5.1)
Sodium: 135 mmol/L (ref 135–145)
Total Bilirubin: 1.2 mg/dL (ref 0.3–1.2)
Total Protein: 7.9 g/dL (ref 6.5–8.1)

## 2021-05-01 LAB — ABO/RH: ABO/RH(D): O POS

## 2021-05-01 LAB — CBC
HCT: 25 % — ABNORMAL LOW (ref 39.0–52.0)
Hemoglobin: 8.7 g/dL — ABNORMAL LOW (ref 13.0–17.0)
MCH: 30.7 pg (ref 26.0–34.0)
MCHC: 34.8 g/dL (ref 30.0–36.0)
MCV: 88.3 fL (ref 80.0–100.0)
Platelets: 190 10*3/uL (ref 150–400)
RBC: 2.83 MIL/uL — ABNORMAL LOW (ref 4.22–5.81)
RDW: 13.1 % (ref 11.5–15.5)
WBC: 17.3 10*3/uL — ABNORMAL HIGH (ref 4.0–10.5)
nRBC: 0 % (ref 0.0–0.2)

## 2021-05-01 LAB — TROPONIN I (HIGH SENSITIVITY): Troponin I (High Sensitivity): 296 ng/L (ref <18)

## 2021-05-01 LAB — URINE CULTURE: Culture: NO GROWTH

## 2021-05-01 LAB — HEPATITIS B SURFACE ANTIGEN: Hepatitis B Surface Ag: NONREACTIVE

## 2021-05-01 LAB — MAGNESIUM
Magnesium: 2 mg/dL (ref 1.7–2.4)
Magnesium: 2 mg/dL (ref 1.7–2.4)

## 2021-05-01 LAB — TRIGLYCERIDES: Triglycerides: 96 mg/dL (ref <150)

## 2021-05-01 MED ORDER — VITAL 1.5 CAL PO LIQD
1000.0000 mL | ORAL | Status: DC
  Administered 2021-05-01 – 2021-05-04 (×3): 1000 mL

## 2021-05-01 MED ORDER — PROSOURCE TF PO LIQD
45.0000 mL | Freq: Four times a day (QID) | ORAL | Status: DC
  Administered 2021-05-01 – 2021-05-06 (×19): 45 mL
  Filled 2021-05-01 (×19): qty 45

## 2021-05-01 MED ORDER — POTASSIUM CHLORIDE 10 MEQ/100ML IV SOLN
10.0000 meq | Freq: Once | INTRAVENOUS | Status: AC
  Administered 2021-05-01: 10 meq via INTRAVENOUS
  Filled 2021-05-01: qty 100

## 2021-05-01 MED ORDER — LEVETIRACETAM IN NACL 1500 MG/100ML IV SOLN
1500.0000 mg | Freq: Once | INTRAVENOUS | Status: AC
  Administered 2021-05-01: 1500 mg via INTRAVENOUS
  Filled 2021-05-01: qty 100

## 2021-05-01 MED ORDER — FENTANYL CITRATE (PF) 100 MCG/2ML IJ SOLN: 50.0000 ug | Freq: Once | INTRAMUSCULAR | Status: DC

## 2021-05-01 NOTE — Progress Notes (Signed)
Dear Doctor: This patient has been identified as a candidate for CVC for the following reason (s): multiple infusions, poor vasculature, restricted LUE. NOT candidate for PICC or Midline d/t renal function. If you agree, please write an order for the indicated device.   Thank you for supporting the early vascular access assessment program.

## 2021-05-01 NOTE — Progress Notes (Signed)
LTM maintenance completed; no skin breakdown was seen, reprepped under Fp1, Fp2, F4, 01, A1, T8, P8 and O2. Tested event button.

## 2021-05-01 NOTE — Progress Notes (Signed)
Initial Nutrition Assessment  DOCUMENTATION CODES:   Not applicable  INTERVENTION:   Tube feeding:  -Vital 1.5 @ 20 ml/hr via OG -Increase by 10 ml Q4 hours to goal rate of 55 ml/hr (1320 ml) -ProSource 45 ml QID -Add Renal MVI  Provides: 2140 kcals, 133 grams protein, 1008 ml free water.   NUTRITION DIAGNOSIS:   Increased nutrient needs related to acute illness as evidenced by estimated needs.  GOAL:   Patient will meet greater than or equal to 90% of their needs  MONITOR:   Vent status, Skin, TF tolerance, Weight trends, Labs, I & O's  REASON FOR ASSESSMENT:   Ventilator    ASSESSMENT:   Patient with PMH significant for ESRD on HD, DM, CHF, HTN, and recent COVID infection 2020. Presents this admission with out of hospital cardiac arrest and volume overload after missing HD session.  Pt discussed during ICU rounds and with RN.   Off levophed. On low dose propofol. LTM EEG monitoring- having myoclonic seizures. Had HD last night. Xray confirms OG side port located in the gastric antrum. Okay to feed per CCM.   Of note patient does not have regular bowel movements at baseline. Recommend continuing scheduled bowel regimen.   EDW: 106.5 kg  Current weight: 113 kg   UOP:20 ml x 24 hrs  HD net UF: 3509 ml x 24 hrs   Drips: propofol (7.2 ml/hr- provides 190 kcal from lipids) Medications: calcitriol, colace, SS novolog, miralax Labs: K 3.4 (L)   NUTRITION - FOCUSED PHYSICAL EXAM:  Flowsheet Row Most Recent Value  Orbital Region No depletion  Upper Arm Region No depletion  Thoracic and Lumbar Region Unable to assess  Buccal Region No depletion  Temple Region No depletion  Clavicle Bone Region No depletion  Clavicle and Acromion Bone Region No depletion  Scapular Bone Region Unable to assess  Dorsal Hand No depletion  Patellar Region No depletion  Anterior Thigh Region No depletion  Posterior Calf Region No depletion  Edema (RD Assessment) Mild  Hair  Reviewed  Eyes Unable to assess  Mouth Unable to assess  Skin Reviewed  Nails Reviewed      Diet Order:   Diet Order     None       EDUCATION NEEDS:   Not appropriate for education at this time  Skin:  Skin Assessment: Reviewed RN Assessment  Last BM:  unknown  Height:   Ht Readings from Last 1 Encounters:  04/20/2021 '5\' 9"'$  (1.753 m)    Weight:   Wt Readings from Last 1 Encounters:  05/01/21 113 kg    BMI:  Body mass index is 36.79 kg/m.  Estimated Nutritional Needs:   Kcal:  2100-2300 kcal  Protein:  130-150 grams  Fluid:  1000 ml + UOP   Audryna Wendt MS, RD, LDN, CNSC Clinical Nutrition Pager listed in [facility]

## 2021-05-01 NOTE — Procedures (Addendum)
Patient Name: Danny Patrick.  MRN: [id number]  Epilepsy Attending: Lora Havens  Referring Physician/Provider: Noe Gens, NP Date: 05/09/2021 Duration: 22.22 mins  Patient history: 60 year old male status post cardiac arrest noted to have seizure-like episodes.  EEG to evaluate for seizures.  Level of alertness: comatose  AEDs during EEG study: Propofol  Technical aspects: This EEG study was done with scalp electrodes positioned according to the 10-20 International system of electrode placement. Electrical activity was acquired at a sampling rate of '500Hz'$  and reviewed with a high frequency filter of '70Hz'$  and a low frequency filter of '1Hz'$ . EEG data were recorded continuously and digitally stored.   Description: EEG showed continuous generalized low amplitude 2 to 3 Hz delta slowing. Patient was noted to have intermittent episodes of brief whole body jerks. Concomitant EEG showed generalized polyspikes consistent with myoclonic seizure.  Hyperventilation and photic stimulation were not performed.     ABNORMALITY - Myoclonic seizure, generalized - Continuous slow, generalized  IMPRESSION: This study showed intermittent myoclonic seizures with generalized onset as well as severe degree of encephalopathy.  With history of cardiac arrest, this is likely secondary to anoxic/hypoxic brain injury.  [name] Barbra Sarks

## 2021-05-01 NOTE — Care Management (Signed)
Discussed advance directive with son, he is unsure if he has one, he will talk to his friend Candice and see if she knows. If it is not documented, then the patient would be the only one to make the directive, and he is sedated and intubated.

## 2021-05-01 NOTE — Procedures (Addendum)
Patient Name: Danny Patrick.  MRN: [id number]  Epilepsy Attending: Lora Havens  Referring Physician/Provider: Noe Gens, NP Duration: 04/16/2021 1723 to 05/01/2021 1723   Patient history: 60 year old male status post cardiac arrest noted to have seizure-like episodes.  EEG to evaluate for seizures.   Level of alertness: comatose   AEDs during EEG study: Propofol   Technical aspects: This EEG study was done with scalp electrodes positioned according to the 10-20 International system of electrode placement. Electrical activity was acquired at a sampling rate of '500Hz'$  and reviewed with a high frequency filter of '70Hz'$  and a low frequency filter of '1Hz'$ . EEG data were recorded continuously and digitally stored.    Description: EEG initially showed continuous generalized low amplitude 2 to 3 Hz delta slowing. Patient was noted to have intermittent episodes of brief whole body jerks, once every few minutes. Concomitant EEG showed generalized polyspikes consistent with myoclonic seizure. After around 1900 on 04/19/2021 as sedation was adjusted, myoclonic seizures improved. EEG then showed burst suppression pattern with burst of high amplitude sharply contoured 3-'5Hz'$  theta-delta slowing lasting 1-2 seconds and eeg suppression lasting 2-5 seconds. Hyperventilation and photic stimulation were not performed.      ABNORMALITY - Myoclonic seizure, generalized - Continuous slow, generalized - Burst suppression, generalized   IMPRESSION: This study initially showed myoclonic seizures with generalized onset, once every few minutes as well as severe encephalopathy.  After around 1900 on 05/04/2021 as sedation was adjusted, myoclonic seizures improved and eeg was suggestive of profound diffuse encephalopathy. With history of cardiac arrest, this is likely secondary to anoxic/hypoxic brain injury.   [name]

## 2021-05-01 NOTE — Progress Notes (Signed)
Received consult for PIV placement. Upon assessment of pt RUE. Pt had tremor, vein spasmed, restricted L arm d/t fistula, unable to place PIV at this time. Nurse stated "waiting for call back from doctor" regarding POC. Requested VAST to come back. Instructed nurse to reconsult VAST after discussing POC with MD. Fran Lowes, RN [facility]

## 2021-05-01 NOTE — Progress Notes (Signed)
[facility] Progress Note  Subjective: on vent and continuous EEG monitor  Vitals:   05/01/21 0645 05/01/21 0700 05/01/21 0740 05/01/21 0800  BP: (!) 151/65 137/68  (!) 164/73  Pulse: 74 80  82  Resp: _0 Temp: 99 F (37.2 C) 99 F (37.2 C)  99 F (37.2 C)  TempSrc:      SpO2: 100% 100% 100% 100%  Weight:      Height:        Exam: Gen on vent, sedated, not responsive Sclera anicteric, throat w/ ETT No jvd or bruits Chest clear anterior/ lateral RRR no MRG Abd obese, soft, no ascites  GU normal male MS no joint effusions or deformity Ext no LE or UE edema, no wounds or ulcers Neuro is on vent, sedated L FA AVF+bruit        Home meds include renvela 800 tid, prns    CXR IMPRESSION: Endotracheal tube in the proximal right mainstem bronchus. Withdraw at least 2 cm to be above the carina. Cardiomegaly and pulmonary edema.      OP HD: transient at [facility] MWF - home unit is [facility] in [place], [place]    [phone])   4h  106.5kg   Heparin 25u/ kg   LFA AVF  - rocaltrol 0.5 ug tiw     Assessment/ Plan: Cardiac arrest - downtime 15-20 minutes, on vent. Severe pulm edema on admit CXR.  Acute resp failure - CXR showing pulm edema, missed HD last week on Friday. CXR much better today, 3.5 L off w/ HD yesterday.  ESRD - on HD in [place] MWF using L arm AVF. Visiting family here. HD started in 2019. Had HD here yest 8/22. Plan next HD tomorrow.  BP / volume - not sure if on BP lowering meds at home. BP's normal to high here. Cont to lower vol w/ HD as tolerated.  Anemia ckd - Hb 9's here, follow MBD ckd - cont rocaltrol tiw, binder when eating      Danny Patrick 05/01/2021, 10:58 AM   Recent Labs  Lab 04/23/2021 1039 05/09/2021 1040 04/20/2021 1229 04/28/2021 1516 05/01/21 0120  K 3.9 3.7  3.8   < > 4.5 3.4*  BUN 98* 108*  --   --  38*  CREATININE 15.02* 16.60*  --   --  6.72*  CALCIUM 7.2*  --   --   --  8.6*  PHOS 9.5*  --   --   --   --   HGB  8.2* 8.5*  8.2*   < > 9.5* 8.7*   < > = values in this interval not displayed.   Inpatient medications:  acetaminophen  650 mg Oral Q4H   Or   acetaminophen (TYLENOL) oral liquid 160 mg/5 mL  650 mg Per Tube Q4H   Or   acetaminophen  650 mg Rectal Q4H   calcitRIOL  0.5 mcg Per Tube Daily   chlorhexidine gluconate (MEDLINE KIT)  15 mL Mouth Rinse BID   Chlorhexidine Gluconate Cloth  6 each Topical Q0600   docusate  100 mg Per Tube BID   heparin  5,000 Units Subcutaneous Q8H   insulin aspart  0-9 Units Subcutaneous Q4H   mouth rinse  15 mL Mouth Rinse 10 times per day   pantoprazole (PROTONIX) IV  40 mg Intravenous QHS   polyethylene glycol  17 g Per Tube Daily    sodium chloride     sodium chloride  norepinephrine (LEVOPHED) Adult infusion Stopped (05/01/21 0644)   propofol (DIPRIVAN) infusion 5 mcg/kg/min (05/01/21 0800)   fentaNYL (SUBLIMAZE) injection, heparin, labetalol, ondansetron (ZOFRAN) IV

## 2021-05-01 NOTE — Progress Notes (Signed)
NAME:  Danny Rothrock., MRN:  [id number], DOB:  [birth year], LOS: 1 ADMISSION DATE:  05/05/2021, CONSULTATION DATE:  05/09/2021 REFERRING MD:  Dr. Tyrone Nine, CHIEF COMPLAINT:  Cardiac Arrest    History of Present Illness:  59 y/o M who presented to [facility] ER on 8/22 via EMS post cardiac arrest.   The patient recently moved to [facility] in the last two months.  He returned back to [facility] to go to kidney transplant classes on Thursday 04/26/21.  He reported he had been more short of breath recently with activity.  Family noted he was having to pick his legs up to get them into the car.  He missed HD on Friday 8/19 after traveling.  Family reports they were at home am of presentation.  The son left to go to the restroom for a couple of minutes and when he returned he noted the patient to have snoring respirations (pt snores when he sleeps per son and recognized the abnormal sounds). Family called 911 & initiated CPR.  Initial rhythm for EMS was PEA with a rate of 30.  He was given EPI x3.  His HR became elevated after EPI and patient was cardioverted per EMS.  He was bagged into the ER and intubated on arrival.  Received 30 mg etomidate, 100 mg rocuronium for intubation. Initial VBG 7.316, CO2 30.  ISTAT labs - Na 137, K 3.7, glucose 205, BUN 108, CR 16.6, ionized calcium 0.84, WBC 15.7, Hgb 8.2.  CXR showed a right mainstem intubation.  He had spontaneous respirations and was started on propofol.  Unfortunately, he suffered an additional arrest in the ER requiring ACLS x1 round.  He was treated with LR x1 L, & pan cultured.  UDS negative. UA with elevated glucose, negative ketones, protein >300, rare bacteria.  Post arrest ABG 7.134 / 54.3 / 128 / 18.2.  CXR showed right mainstem intubation (adjusted in ER), cardiomegaly and pulmonary edema.    Friend reports he does not normally have regular bowel movements.  Noted he had difficulty buttoning his pants on 8/21. He had COVID in 2020 and has been vaccinated.   PCCM called  for ICU admission.   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 rate 18, height '5\' 9"'$  (1.753 m), weight 113 kg, SpO2 100 %.    Vent Mode: PRVC FiO2 (%):  [60 %-100 %] 60 % Set Rate:  [14 bmp-18 bmp] 18 bmp Vt Set:  [560 mL] 560 mL PEEP:  [5 cmH20] 5 cmH20 Plateau Pressure:  [17 cmH20-24 cmH20] 19 cmH20   Intake/Output Summary (Last 24 hours) at 05/01/2021 [id number] data filed at 05/01/2021 0800 Gross per 24 hour  Intake 314.74 ml  Output 3829 ml  Net -3514.26 ml   Filed Weights   04/26/2021 1000 05/01/21 0455  Weight: 120 kg 113 kg    General: Well-nourished male, intubated and sedated HEENT: MM pink/moist right conjunctiva hematest and injected with clouding of  the iris and pupil, left pupil reactive Neuro: Unresponsive to voice, slight withdrawal to pain bilateral upper extremities, triggering vent, minimal gag, positive corneals CV: s1s2 RRR, no m/r/g PULM: Decreased air movement bilateral bases, on full vent support without significant rhonchi or wheezing, vent pressures at goal GI: soft, bsx4 active  Extremities: warm/dry, no edema  Skin: no rashes or lesions   Resolved Hospital Problem list      Assessment & Plan:   Out of Hospital Cardiac Arrest  Acute Hypoxic, Hypercarbic Respiratory Failure with subsequent myoclonic seizure activity Initial rhythm PEA per EMS.  Second arrest in ER requiring additional  CPR. Troponin 66->167. Suspicion for volume overload with missed HD leading to respiratory > cardiac arrest.  EKG negative. Estimated downtime 15-20 minutes total.  Echocardiogram with LVEF 55-60%, LVH, severely elevated pulmonary artery systolic pressure P: -Continue supportive care with normothermia, normoxia, euglycemia, will likely need MRI after 48 hours to assist with prognostication -Briefly required Levophed overnight, not currently in shock -Initial head CT with no acute intracranial findings -Continue propofol for myoclonic seizures, initiate Keppra, may need neurology consult -Trend 1 more troponin, consider heparin GTT if considerably elevated -Maintain full vent support with SAT/SBT as tolerated -titrate Vent setting to maintain SpO2 greater than or equal to 90%. -HOB elevated 30 degrees. -Plateau pressures less than 30 cm H20.  -Follow chest x-ray, ABG prn.   -Bronchial hygiene and RT/bronchodilator protocol.    Acute Metabolic Encephalopathy  UDS negative. Hypercarbia noted on ABG.  At risk hypoxic injury given arrest.  P: -Continue propofol and fentanyl along with initiation of Keppra for myoclonic seizures -Continue LTM EEG     ESRD HD M/W/F.  Has been on HD since 2019.  Was back in [place] to attend "kidney classes" for possible transplant P: -Appreciate nephrology consult, dialyzed overnight with over 3 L removed -Trend BMP / urinary output -Replace electrolytes as indicated, calcium as below   Hypocalcemia  -1gm calcium gluconate given yesterday  Elevated LFT's  Suspect in setting of post arrest  -AST 56, ALT 55 today, no significant increase from yesterday   DM -Continue SSI sensitive scale and serial glucose checks   HTN Labile blood pressure overnight, now off pressors and hypertensive P: -Propofol increased in the setting of seizures, so hold resuming scheduled antihypertensives, continue as needed labetalol    Anemia  Suspect chronic in setting  of renal disease -trend CBC  Best Practice (right click and "Reselect all SmartList Selections" daily)  Diet/type: NPO DVT prophylaxis: prophylactic heparin  GI prophylaxis: PPI Lines: Arterial Line Foley:  N/A Code Status:  full code Last date of multidisciplinary goals of care discussion -update pending for 8/23 Friend - Maryagnes Amos [phone] Son Danny Patrick [id number]  Labs   CBC: Recent Labs  Lab 05/07/2021 1039 04/28/2021 1040 04/13/2021 1229 05/05/2021 1516 05/01/21 0120  WBC 15.7*  --   --   --  17.3*  HGB 8.2* 8.5*  8.2* 9.5* 9.5* 8.7*  HCT 25.0* 25.0*  24.0* 28.0* 28.0* 25.0*  MCV 94.7  --   --   --  88.3  PLT 175  --   --   --  190     Basic Metabolic Panel: Recent Labs  Lab 05/07/2021 1039 04/13/2021 1040 04/28/2021 1229 05/01/2021 1516 05/01/21 0120  NA 139 137  138 140 137 135  K 3.9 3.7  3.8 3.6 4.5 3.4*  CL 104 105  --   --  98  CO2 14*  --   --   --  23  GLUCOSE 207* 205*  --   --  96  BUN 98* 108*  --   --  38*  CREATININE 15.02* 16.60*  --   --  6.72*  CALCIUM 7.2*  --   --   --  8.6*  MG 2.3  --   --   --   --   PHOS 9.5*  --   --   --   --     GFR: Estimated Creatinine Clearance: 14.8 mL/min (A) (by C-G formula based on SCr of 6.72 mg/dL (H)). Recent Labs  Lab 04/29/2021 1039 04/12/2021 1459 04/18/2021 2059 05/01/21 0120  PROCALCITON 0.29  --   --   --   WBC 15.7*  --   --  17.3*  LATICACIDVEN 4.4* 3.6* 1.6  --      Liver Function Tests: Recent Labs  Lab 05/08/2021 1039 05/01/21 0120  AST 72* 56*  ALT 47* 55*  ALKPHOS 88 99  BILITOT 1.0 1.2  PROT 6.5 7.9  ALBUMIN 3.2* 3.7    No results for input(s): LIPASE, AMYLASE in the last 168 hours. No results for input(s): AMMONIA in the last 168 hours.  ABG    Component Value Date/Time   PHART 7.316 (L) 04/09/2021 1516   PCO2ART 38.6 04/11/2021 1516   PO2ART 264 (H) 04/12/2021 1516   HCO3 19.7 (L) 04/24/2021 1516   TCO2 21 (L) 04/19/2021 1516   ACIDBASEDEF 6.0 (H) 04/10/2021  1516   O2SAT 100.0 04/13/2021 1516      Coagulation Profile: Recent Labs  Lab 05/07/2021 1039  INR 1.2     Cardiac Enzymes: No results for input(s): CKTOTAL, CKMB, CKMBINDEX, TROPONINI in the last 168 hours.  HbA1C: Hgb A1c MFr Bld  Date/Time Value Ref Range Status  05/02/2021 10:39 AM 6.7 (H) 4.8 - 5.6 % Final    Comment:    (NOTE) Pre diabetes:          5.7%-6.4%  Diabetes:              >6.4%  Glycemic control for   <7.0% adults with diabetes     CBG: Recent Labs  Lab 05/07/2021 1413 04/10/2021 2004 05/01/21 0049 05/01/21 0329 05/01/21 0829  GLUCAP 170* 153* 96 95 108*     Review of Systems:   Unable to complete as patient is altered on mechanical ventilation.   Past Medical History:  He,  has a past medical history of Anemia, Cardiac murmur, DM (diabetes mellitus) ([place]), ESRD (end stage renal disease) ([place]), HTN (hypertension), and Obesity.   Surgical History:   Past Surgical History:  Procedure Laterality Date   AV FISTULA PLACEMENT  09/13/2019     Social History:      Family History:  His family history is not on file.   Allergies Allergies  Allergen Reactions   Chocolate Other (See Comments)    migraines   Cocoa Other (See Comments)    migraines   Insulin Aspart Swelling   Iron Sucrose Nausea And Vomiting   Lisinopril Cough   Other Swelling    Reaction to artificial sweeteners - splenda, equal   Penicillins Swelling   Vancomycin Other (See Comments)    Acute renal insuff. Inpatient 2015     Home Medications  Prior to Admission medications   Not on File     Critical care time: 38 minutes     CRITICAL CARE Performed by: Otilio Carpen Analicia Skibinski   Total critical care time: 38 minutes  Critical  care time was exclusive of separately billable procedures and treating other patients.  Critical care was necessary to treat or prevent imminent or life-threatening deterioration.  Critical care was time spent personally by me on the following  activities: development of treatment plan with patient and/or surrogate as well as nursing, discussions with consultants, evaluation of patient's response to treatment, examination of patient, obtaining history from patient or surrogate, ordering and performing treatments and interventions, ordering and review of laboratory studies, ordering and review of radiographic studies, pulse oximetry and re-evaluation of patient's condition.   Otilio Carpen Adir Schicker, PA-C [place] [facility] [facility] See Amion for pager If no response to pager , please call [phone] until 7pm After 7:00 pm call Elink  [phone]?[place]

## 2021-05-01 NOTE — Progress Notes (Signed)
[facility] Progress Note Patient Name: Danny Patrick. DOB: 1961-02-28 MRN: [id number]   Date of Service  05/01/2021  HPI/Events of Note  K+ 3.4 at 0120  post HD   on vent with PIV/OG  eICU Interventions  Kcl 10 meq once IV.     Intervention Category Minor Interventions: Electrolytes abnormality - evaluation and management  Elmer Sow 05/01/2021, 5:09 AM

## 2021-05-01 NOTE — Progress Notes (Signed)
[facility] Progress Note Patient Name: Danny Patrick. DOB: 1961-09-04 MRN: [id number]   Date of Service  05/01/2021  HPI/Events of Note  Patient shivering on TTM.  eICU Interventions  Propofol ceiling increased to 40, Fentanyl 50 mcg iv x 1 ordered.        Kerry Kass Adiyah Lame 05/01/2021, 11:03 PM

## 2021-05-02 ENCOUNTER — Inpatient Hospital Stay (HOSPITAL_COMMUNITY): Payer: Medicare Other

## 2021-05-02 DIAGNOSIS — R7881 Bacteremia: Secondary | ICD-10-CM | POA: Diagnosis not present

## 2021-05-02 DIAGNOSIS — J9601 Acute respiratory failure with hypoxia: Secondary | ICD-10-CM | POA: Diagnosis not present

## 2021-05-02 DIAGNOSIS — Z992 Dependence on renal dialysis: Secondary | ICD-10-CM

## 2021-05-02 DIAGNOSIS — Z452 Encounter for adjustment and management of vascular access device: Secondary | ICD-10-CM

## 2021-05-02 DIAGNOSIS — I469 Cardiac arrest, cause unspecified: Secondary | ICD-10-CM | POA: Diagnosis not present

## 2021-05-02 DIAGNOSIS — R569 Unspecified convulsions: Secondary | ICD-10-CM | POA: Diagnosis not present

## 2021-05-02 DIAGNOSIS — N186 End stage renal disease: Secondary | ICD-10-CM

## 2021-05-02 LAB — BLOOD CULTURE ID PANEL (REFLEXED) - BCID2

## 2021-05-02 LAB — COMPREHENSIVE METABOLIC PANEL
ALT: 34 U/L (ref 0–44)
AST: 36 U/L (ref 15–41)
Albumin: 2.8 g/dL — ABNORMAL LOW (ref 3.5–5.0)
Alkaline Phosphatase: 89 U/L (ref 38–126)
Anion gap: 14 (ref 5–15)
BUN: 66 mg/dL — ABNORMAL HIGH (ref 6–20)
CO2: 22 mmol/L (ref 22–32)
Calcium: 7.6 mg/dL — ABNORMAL LOW (ref 8.9–10.3)
Chloride: 98 mmol/L (ref 98–111)
Creatinine, Ser: 11.44 mg/dL — ABNORMAL HIGH (ref 0.61–1.24)
GFR, Estimated: 5 mL/min — ABNORMAL LOW (ref >60)
Glucose, Bld: 134 mg/dL — ABNORMAL HIGH (ref 70–99)
Potassium: 4.7 mmol/L (ref 3.5–5.1)
Sodium: 134 mmol/L — ABNORMAL LOW (ref 135–145)
Total Bilirubin: 1.1 mg/dL (ref 0.3–1.2)
Total Protein: 6.3 g/dL — ABNORMAL LOW (ref 6.5–8.1)

## 2021-05-02 LAB — CBC
HCT: 21.4 % — ABNORMAL LOW (ref 39.0–52.0)
Hemoglobin: 7.2 g/dL — ABNORMAL LOW (ref 13.0–17.0)
MCH: 31.2 pg (ref 26.0–34.0)
MCHC: 33.6 g/dL (ref 30.0–36.0)
MCV: 92.6 fL (ref 80.0–100.0)
Platelets: 154 10*3/uL (ref 150–400)
RBC: 2.31 MIL/uL — ABNORMAL LOW (ref 4.22–5.81)
RDW: 13.6 % (ref 11.5–15.5)
WBC: 17.1 10*3/uL — ABNORMAL HIGH (ref 4.0–10.5)
nRBC: 0 % (ref 0.0–0.2)

## 2021-05-02 LAB — GLUCOSE, CAPILLARY
Glucose-Capillary: 104 mg/dL — ABNORMAL HIGH (ref 70–99)
Glucose-Capillary: 117 mg/dL — ABNORMAL HIGH (ref 70–99)
Glucose-Capillary: 130 mg/dL — ABNORMAL HIGH (ref 70–99)
Glucose-Capillary: 134 mg/dL — ABNORMAL HIGH (ref 70–99)
Glucose-Capillary: 164 mg/dL — ABNORMAL HIGH (ref 70–99)
Glucose-Capillary: 192 mg/dL — ABNORMAL HIGH (ref 70–99)

## 2021-05-02 LAB — PHOSPHORUS: Phosphorus: 7.8 mg/dL — ABNORMAL HIGH (ref 2.5–4.6)

## 2021-05-02 LAB — TROPONIN I (HIGH SENSITIVITY)
Troponin I (High Sensitivity): 164 ng/L (ref <18)
Troponin I (High Sensitivity): 116 ng/L (ref <18)

## 2021-05-02 LAB — HEPATITIS B SURFACE ANTIBODY, QUANTITATIVE: Hep B S AB Quant (Post): <3.1 m[IU]/mL — ABNORMAL LOW (ref >9.9)

## 2021-05-02 LAB — MAGNESIUM: Magnesium: 2.1 mg/dL (ref 1.7–2.4)

## 2021-05-02 MED ORDER — VANCOMYCIN HCL 2000 MG/400ML IV SOLN
2000.0000 mg | Freq: Once | INTRAVENOUS | Status: AC
  Administered 2021-05-02: 2000 mg via INTRAVENOUS
  Filled 2021-05-02: qty 400

## 2021-05-02 MED ORDER — NOREPINEPHRINE 4 MG/250ML-% IV SOLN
0.0000 ug/min | INTRAVENOUS | Status: DC
Start: 2021-05-02 — End: 2021-05-02
  Administered 2021-05-02: 30 ug/min via INTRAVENOUS
  Filled 2021-05-02 (×2): qty 250

## 2021-05-02 MED ORDER — BUSPIRONE HCL 10 MG PO TABS
30.0000 mg | ORAL_TABLET | Freq: Three times a day (TID) | ORAL | Status: DC
  Administered 2021-05-02 – 2021-05-06 (×11): 30 mg
  Filled 2021-05-02 (×13): qty 3

## 2021-05-02 MED ORDER — VANCOMYCIN HCL IN DEXTROSE 1-5 GM/200ML-% IV SOLN: 1000.0000 mg | INTRAVENOUS | Status: DC

## 2021-05-02 MED ORDER — CISATRACURIUM BESYLATE 20 MG/10ML IV SOLN
10.0000 mg | Freq: Once | INTRAVENOUS | Status: AC
  Administered 2021-05-02: 10 mg via INTRAVENOUS
  Filled 2021-05-02: qty 10

## 2021-05-02 MED ORDER — NOREPINEPHRINE 16 MG/250ML-% IV SOLN
0.0000 ug/min | INTRAVENOUS | Status: DC
Start: 2021-05-03 — End: 2021-05-07
  Administered 2021-05-02: 2 ug/min via INTRAVENOUS
  Administered 2021-05-03: 27 ug/min via INTRAVENOUS
  Administered 2021-05-04: 17 ug/min via INTRAVENOUS
  Administered 2021-05-05: 8 ug/min via INTRAVENOUS
  Filled 2021-05-02 (×5): qty 250

## 2021-05-02 MED ORDER — FENTANYL 2500MCG IN NS 250ML (10MCG/ML) PREMIX INFUSION
0.0000 ug/h | INTRAVENOUS | Status: DC
  Administered 2021-05-02: 25 ug/h via INTRAVENOUS
  Administered 2021-05-03: 125 ug/h via INTRAVENOUS
  Filled 2021-05-02 (×2): qty 250

## 2021-05-02 MED ORDER — VANCOMYCIN VARIABLE DOSE PER UNSTABLE RENAL FUNCTION (PHARMACIST DOSING): Status: DC

## 2021-05-02 MED ORDER — SODIUM CHLORIDE 0.9 % IV SOLN
1.0000 g | INTRAVENOUS | Status: DC
  Administered 2021-05-02 – 2021-05-03 (×2): 1 g via INTRAVENOUS
  Filled 2021-05-02 (×3): qty 10

## 2021-05-02 MED ORDER — VANCOMYCIN HCL IN DEXTROSE 1-5 GM/200ML-% IV SOLN
1000.0000 mg | INTRAVENOUS | Status: DC
  Administered 2021-05-02: 1000 mg via INTRAVENOUS
  Filled 2021-05-02: qty 200

## 2021-05-02 NOTE — Progress Notes (Addendum)
[facility] Progress Note Patient Name: Danny Patrick. DOB: 09/21/1960 MRN: [id number]   Date of Service  05/02/2021  HPI/Events of Note  CXR reviewed post CVL insertion.  eICU Interventions  Line in good position, no pneumothorax.         Kerry Kass Mansa Willers 05/02/2021, 3:07 AM

## 2021-05-02 NOTE — Progress Notes (Signed)
Pharmacy Antibiotic Note  Danny Patrick. is a 60 y.o. male admitted on 04/28/2021 with Staphylococcus lugdunensis bacteremia.  Pharmacy has been consulted for Vancomycin dosing. Pt is ESRD -noted plan for next HD 8/24.  Plan: Vancomycin '2000mg'$  IV now then '1000mg'$  IV qHD Will f/u HD schedule/tolerance, micro data, and pt's clinical condition Vanc levels prn   Height: '5\' 9"'$  (175.3 cm) Weight: 113 kg (249 lb 1.9 oz) IBW/kg (Calculated) : 70.7  Temp (24hrs), Avg:98.9 F (37.2 C), Min:97.5 F (36.4 C), Max:99.9 F (37.7 C)  Recent Labs  Lab 05/07/2021 1039 05/09/2021 1040 04/25/2021 1459 04/14/2021 2059 05/01/21 0120  WBC 15.7*  --   --   --  17.3*  CREATININE 15.02* 16.60*  --   --  6.72*  LATICACIDVEN 4.4*  --  3.6* 1.6  --     Estimated Creatinine Clearance: 14.8 mL/min (A) (by C-G formula based on SCr of 6.72 mg/dL (H)).    Allergies  Allergen Reactions   Chocolate Other (See Comments)    migraines   Cocoa Other (See Comments)    migraines   Insulin Aspart Swelling   Iron Sucrose Nausea And Vomiting   Lisinopril Cough   Other Swelling    Reaction to artificial sweeteners - splenda, equal   Penicillins Swelling   Vancomycin Other (See Comments)    Acute renal insuff. Inpatient 2015    Antimicrobials this admission: 8/24 Vanc >>   Microbiology results: 8/22 BCx: 1/4 staph lugdunensis 8/22 UCx: negative  8/22 MRSA PCR: negative  Thank you for allowing pharmacy to be a part of this patient's care.  Sherlon Handing, PharmD, BCPS Please see amion for complete clinical pharmacist phone list 05/02/2021 3:24 AM

## 2021-05-02 NOTE — Progress Notes (Addendum)
[facility] Progress Note Patient Name: Danny Patrick. DOB: [birth year] MRN: [id number]   Date of Service  05/02/2021  HPI/Events of Note  Patient with persistent shivering. His blood pressure has been sensitive to Fentanyl. Patient needs additional iv access.  eICU Interventions  Nimbex 10 mg iv x 1 ordered. PCCM Ground Crew requested to place a central line.        Kerry Kass Alika Eppes 05/02/2021, 12:40 AM

## 2021-05-02 NOTE — Procedures (Signed)
Central Venous Catheter Insertion Procedure Note  Danny Patrick  [id number]  [birth year]  Date:05/02/21  Time:1:39 AM   Provider Performing:Seong-Joo Carson Myrtle   Procedure: Insertion of Non-tunneled Central Venous Catheter(36556) without US guidance  Indication(s) Medication administration and Difficult access  Consent Unable to obtain consent due to emergent nature of procedure.  Anesthesia none  Timeout Verified patient identification, verified procedure, site/side was marked, verified correct patient position, special equipment/implants available, medications/allergies/relevant history reviewed, required imaging and test results available.  Sterile Technique Maximal sterile technique including full sterile barrier drape, hand hygiene, sterile gown, sterile gloves, mask, hair covering, sterile ultrasound probe cover (if used).  Procedure Description Area of catheter insertion was cleaned with chlorhexidine and draped in sterile fashion.  Without real-time ultrasound guidance a central venous catheter was placed into the right subclavian vein. Nonpulsatile blood flow and easy flushing noted in all ports.  The catheter was sutured in place and sterile dressing applied.  Complications/Tolerance None; patient tolerated the procedure well. Chest X-ray is ordered to verify placement.  EBL Minimal  Specimen(s) None  Renee Pain, MD Board Certified by the ABIM, [place]

## 2021-05-02 NOTE — Progress Notes (Addendum)
NAME:  Danny Patrick., MRN:  [id number], DOB:  05-21-1961, LOS: 2 ADMISSION DATE:  05/09/2021, CONSULTATION DATE:  04/16/2021 REFERRING MD:  Dr. Tyrone Nine, CHIEF COMPLAINT:  Cardiac Arrest    History of Present Illness:  60 y/o M who presented to [facility] ER on 8/22 via EMS post cardiac arrest.   The patient recently moved to [facility] in the last two months.  He returned back to [facility] to go to kidney transplant classes on Thursday 04/26/21.  He reported he had been more short of breath recently with activity.  Family noted he was having to pick his legs up to get them into the car.  He missed HD on Friday 8/19 after traveling.  Family reports they were at home am of presentation.  The son left to go to the restroom for a couple of minutes and when he returned he noted the patient to have snoring respirations (pt snores when he sleeps per son and recognized the abnormal sounds). Family called 911 & initiated CPR.  Initial rhythm for EMS was PEA with a rate of 30.  He was given EPI x3.  His HR became elevated after EPI and patient was cardioverted per EMS.  He was bagged into the ER and intubated on arrival.  Received 30 mg etomidate, 100 mg rocuronium for intubation. Initial VBG 7.316, CO2 30.  ISTAT labs - Na 137, K 3.7, glucose 205, BUN 108, CR 16.6, ionized calcium 0.84, WBC 15.7, Hgb 8.2.  CXR showed a right mainstem intubation.  He had spontaneous respirations and was started on propofol.  Unfortunately, he suffered an additional arrest in the ER requiring ACLS x1 round.  He was treated with LR x1 L, & pan cultured.  UDS negative. UA with elevated glucose, negative ketones, protein >300, rare bacteria.  Post arrest ABG 7.134 / 54.3 / 128 / 18.2.  CXR showed right mainstem intubation (adjusted in ER), cardiomegaly and pulmonary edema.    Friend reports he does not normally have regular bowel movements.  Noted he had difficulty buttoning his pants on 8/21. He had COVID in 2020 and has been vaccinated.   PCCM called  for ICU admission.   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   Objective   Blood pressure (!) 100/54, pulse 82, temperature 99.5 F (37.5 C), resp. rate 16, height '5\' 9"'$  (1.753 m), weight 110.8 kg, SpO2 100 %.    Vent Mode: PRVC FiO2 (%):  [40 %] 40 % Set Rate:  [18 bmp] 18 bmp Vt Set:  [560 mL] 560 mL PEEP:  [5 cmH20] 5 cmH20 Plateau Pressure:  [6 cmH20-22 cmH20] 13 cmH20   Intake/Output Summary (Last 24 hours) at 05/02/2021 0904 Last data filed at 05/02/2021 0600 Gross per 24 hour  Intake 1349.23 ml  Output 32 ml  Net 1317.23 ml    Filed Weights   05/01/21 0455 05/02/21 0412 05/02/21 0745  Weight: 113 kg 110.8 kg 110.8 kg    General:  elderly appearing male in NAD on vent Neuro:  Sedated, R pupil irregular. L pupil  reactive.  HEENT:  [place]/AT, No JVD noted Cardiovascular:  RRR, no MRG Lungs:  Clear bilateral breath sounds. Vent dyssynchrony  Abdomen:  Soft, non-distended, non-tender. Musculoskeletal:  No acute deformity Skin:  Intact, MMM   Resolved Hospital Problem list      Assessment & Plan:   Out of Hospital Cardiac Arrest  Acute Hypoxic, Hypercarbic Respiratory Failure with subsequent myoclonic seizure activity Initial rhythm PEA per EMS.  Second arrest in ER requiring additional CPR. Troponin 66->167. Suspicion for volume overload with missed HD leading to respiratory > cardiac arrest.  EKG negative. Estimated downtime 15-20 minutes total.  Initial head CT with no acute intracranial findings. Echocardiogram with LVEF 55-60%, LVH,  severely elevated pulmonary artery systolic pressure P: -TTM protocol ongoing 37C -Continue propofol sedation per protocol -Add fentanyl infusion. RASS goal -4 to -5 -Keppra for myoclonus -Add buspar for shivering.  -Repeat troponin hoping it has peaked.  -Titrate vent setting to maintain SpO2 greater than or equal to 90%. - VAP bundle - Will plan to wean sedation tomorrow per protocol.   Acute Metabolic Encephalopathy  UDS negative. Hypercarbia noted on ABG.  At risk hypoxic injury given arrest.  P: -Continue propofol and fentanyl along with initiation of Keppra for myoclonic seizures -Continue LTM EEG  Possible aspiration ? Bacteremia: Staphylococcus lugdunensis by BCID in 1/4 bottles. Possibly contaminant.  P: - Vancomycin per pharmacy. Will continue for now. Low threshold to DC.  - Start ceftriaxone r/o aspiration with ongoing fevers/low water temp on arctic sun.   ESRD HD M/W/F.  Has been on HD since 2019.  Was back in [place] to attend "kidney classes" for possible transplant P: -Appreciate nephrology consult, dialyzed overnight with over 3 L removed -Trend BMP / urinary output -Replace electrolytes as indicated, calcium as below   Elevated LFT's  Suspect in setting of post arrest  -improved  DM -Continue SSI sensitive scale and serial glucose checks  HTN Labile blood pressure overnight, now off pressors and hypertensive P: -Propofol increased in the setting of seizures, so hold resuming scheduled antihypertensives, continue as needed labetalol  Anemia  Suspect chronic in setting of renal disease -trend CBC  Best Practice (right click and "Reselect all SmartList Selections" daily)  Diet/type: tubefeeds and NPO w/ meds via tube DVT prophylaxis: prophylactic heparin  GI prophylaxis: PPI Lines: Arterial Line Foley:  N/A Code Status:  full code  Friend Maryagnes Amos [phone] Son Lynard Boitano [phone]  Labs   CBC: Recent Labs  Lab 04/09/2021 1039  04/13/2021 1040 04/22/2021 1229 04/29/2021 1516 05/01/21 0120 05/02/21 0408  WBC 15.7*  --   --   --  17.3* 17.1*  HGB 8.2* 8.5*  8.2* 9.5* 9.5* 8.7* 7.2*  HCT 25.0* 25.0*  24.0* 28.0* 28.0* 25.0* 21.4*  MCV 94.7  --   --   --  88.3 92.6  PLT 175  --   --   --  190 154     Basic Metabolic Panel: Recent Labs  Lab 05/04/2021 1039 04/15/2021 1040 04/16/2021 1229 04/15/2021 1516 05/01/21 0120 05/01/21 1356 05/01/21 1752 05/02/21 0408  NA 139 137  138 140 137 135  --   --  134*  K 3.9 3.7  3.8 3.6 4.5 3.4*  --   --  4.7  CL 104 105  --   --  98  --   --  98  CO2 14*  --   --   --  23  --   --  22  GLUCOSE 207* 205*  --   --  96  --   --  134*  BUN 98* 108*  --   --  38*  --   --  66*  CREATININE 15.02* 16.60*  --   --  6.72*  --   --  11.44*  CALCIUM 7.2*  --   --   --  8.6*  --   --  7.6*  MG 2.3  --   --   --   --  2.0 2.0 2.1  PHOS 9.5*  --   --   --   --  6.5* 7.5* 7.8*       Critical care time: 37 minutes      Georgann Housekeeper, AGACNP-BC [place] [facility]  See Amion for personal pager PCCM on call pager [phone] until 7pm. Please call Elink 7p-7a. [phone]  05/02/2021 9:11 AM

## 2021-05-02 NOTE — Progress Notes (Signed)
EEG in doing maintenance. No skin break down noted. Patient event button tested.

## 2021-05-02 NOTE — Progress Notes (Signed)
[facility] Progress Note  Subjective: on vent and continuous EEG monitor  Vitals:   05/02/21 0815 05/02/21 0830 05/02/21 0845 05/02/21 0900  BP:      Pulse: 83 78 80 82  Resp: _0 Temp: 99 F (37.2 C) 99.1 F (37.3 C) 99.3 F (37.4 C) 99.5 F (37.5 C)  TempSrc:      SpO2: 100% 100% 100% 100%  Weight:      Height:        Exam: Gen on vent, sedated, not responsive Sclera anicteric, throat w/ ETT No jvd or bruits Chest clear anterior/ lateral RRR no MRG Abd obese, soft, no ascites  GU normal male MS no joint effusions or deformity Ext no LE or UE edema, no wounds or ulcers Neuro is on vent, sedated L FA AVF+bruit        Home meds include renvela 800 tid, prns    CXR IMPRESSION: Endotracheal tube in the proximal right mainstem bronchus. Withdraw at least 2 cm to be above the carina. Cardiomegaly and pulmonary edema.      OP HD: transient at [facility] MWF - home unit is [facility] in [place], [place]    [phone])   4h  106.5kg   Heparin 25u/ kg   LFA AVF  - rocaltrol 0.5 ug tiw     Assessment/ Plan: Cardiac arrest - downtime 15-20 minutes, on vent Acute resp failure - CXR showed severe pulm edema, better after 1st HD.  Volume overload - continue to lower volume w/ HD today. Still up 4kg by wts.  ESRD - HD since 2019. Is on HD in [place] MWF using L arm AVF, is visiting family here. Missed last 11-03-22 dialysis due to death in family. HD here May 10, 2023 and is on HD this am.  BP - not sure if on BP lowering meds at home. BP's normal to high here Anemia ckd - Hb 9's here, follow MBD ckd - cont rocaltrol tiw, binder when eating      Rob Teka Chanda 05/02/2021, 9:35 AM   Recent Labs  Lab 05/01/21 0120 05/01/21 1356 05/01/21 1752 05/02/21 0408  K 3.4*  --   --  4.7  BUN 38*  --   --  66*  CREATININE 6.72*  --   --  11.44*  CALCIUM 8.6*  --   --  7.6*  PHOS  --    < > 7.5* 7.8*  HGB 8.7*  --   --  7.2*   < > = values in this interval not displayed.     Inpatient medications:  acetaminophen  650 mg Oral Q4H   Or   acetaminophen (TYLENOL) oral liquid 160 mg/5 mL  650 mg Per Tube Q4H   Or   acetaminophen  650 mg Rectal Q4H   calcitRIOL  0.5 mcg Per Tube Daily   chlorhexidine gluconate (MEDLINE KIT)  15 mL Mouth Rinse BID   Chlorhexidine Gluconate Cloth  6 each Topical Q0600   docusate  100 mg Per Tube BID   feeding supplement (PROSource TF)  45 mL Per Tube QID   fentaNYL (SUBLIMAZE) injection  50 mcg Intravenous Once   heparin  5,000 Units Subcutaneous Q8H   insulin aspart  0-9 Units Subcutaneous Q4H   mouth rinse  15 mL Mouth Rinse 10 times per day   pantoprazole (PROTONIX) IV  40 mg Intravenous QHS   polyethylene glycol  17 g Per Tube Daily   vancomycin variable dose per unstable renal  function (pharmacist dosing)   Does not apply See admin instructions    sodium chloride     sodium chloride     feeding supplement (VITAL 1.5 CAL) 55 mL/hr at 05/02/21 0350   norepinephrine (LEVOPHED) Adult infusion Stopped (05/01/21 0644)   propofol (DIPRIVAN) infusion 40 mcg/kg/min (05/02/21 0600)   fentaNYL (SUBLIMAZE) injection, heparin, labetalol, ondansetron (ZOFRAN) IV

## 2021-05-02 NOTE — Consult Note (Signed)
[facility]  Total days of antibiotics 3         Reason for Consult: staph lugdunensis bacteremia   Referring Physician: auto  Active Problems:   Cardiac arrest [facility])   Acute respiratory failure with hypoxia (HCC)   Seizure (HCC)   ESRD on hemodialysis (HCC)    HPI: Danny Patrick. is a 60 y.o. male with ESRD who missed HD on 8/19 who had witnessed PEA code, estimated 15 min down, found to be encephalopathic likely for anoxic injury and subsequently intubated for respiratory distress, possibly aspiration. He was started on vancomycin and ceftriaxone for possible aspiration. Blood cx in 1 of 4 bottles identified as having staph lugdunensis, unclear if bacteremia is cause of his presentation. He remains critically ill, on ventilator, pressors, and dialysis to address volume overload. He is on continuous EEG to evaluate for seizure/anoxic injury.   Past Medical History:  Diagnosis Date   Anemia    Cardiac murmur    DM (diabetes mellitus) (HCC)    ESRD (end stage renal disease) (HCC)    HTN (hypertension)    Obesity     Allergies:  Allergies  Allergen Reactions   Chocolate Other (See Comments)    migraines   Cocoa Other (See Comments)    migraines   Insulin Aspart Swelling   Iron Sucrose Nausea And Vomiting   Lisinopril Cough   Other Swelling    Reaction to artificial sweeteners - splenda, equal   Penicillins Swelling    Tolerated ceftriaxone 8/24     Current antibiotics:   MEDICATIONS:  acetaminophen  650 mg Oral Q4H   Or   acetaminophen (TYLENOL) oral liquid 160 mg/5 mL  650 mg Per Tube Q4H   Or   acetaminophen  650 mg Rectal Q4H   busPIRone  30 mg Per Tube Q8H   calcitRIOL  0.5 mcg Per Tube Daily   chlorhexidine gluconate (MEDLINE KIT)  15 mL Mouth Rinse BID   Chlorhexidine Gluconate Cloth  6 each Topical Q0600   docusate  100 mg Per Tube BID   feeding supplement (PROSource TF)  45 mL Per Tube QID   fentaNYL (SUBLIMAZE) injection   50 mcg Intravenous Once   heparin  5,000 Units Subcutaneous Q8H   insulin aspart  0-9 Units Subcutaneous Q4H   mouth rinse  15 mL Mouth Rinse 10 times per day   pantoprazole (PROTONIX) IV  40 mg Intravenous QHS   polyethylene glycol  17 g Per Tube Daily       No family history on file.  Review of Systems - unable to obtain due to sedation/intubation   OBJECTIVE: Temp:  [97.3 F (36.3 C)-99.9 F (37.7 C)] 98.8 F (37.1 C) (08/24 1700) Pulse Rate:  [70-95] 81 (08/24 1630) Resp:  [12-33] 16 (08/24 1630) BP: (100-254)/(50-222) 135/56 (08/24 1600) SpO2:  [90 %-100 %] 94 % (08/24 1630) Arterial Line BP: (110-190)/(39-75) 137/42 (08/24 1630) FiO2 (%):  [40 %] 40 % (08/24 1525) Weight:  [107.3 kg-110.8 kg] 107.3 kg (08/24 1130) Physical Exam  Constitutional: intubated/sedated. He appears well-developed and well-nourished. No distress.  HENT: EEG leads in place Mouth/Throat: OETT present Cardiovascular: Normal rate, regular rhythm and normal heart sounds. Distant heart sounds Pulmonary/Chest: Effort normal and breath sounds normal. No respiratory distress. He has no wheezes.  Abdominal: Soft. Bowel sounds are decreased. He exhibits no distension. There is no tenderness.  Lymphadenopathy:  He has no cervical adenopathy.  Neurological: He is alert  and oriented to person, place, and time.  Skin: Skin is warm and dry. No rash noted. No erythema.  Psychiatric: He has a normal mood and affect. His behavior is normal.    LABS: Results for orders placed or performed during the hospital encounter of 04/14/2021 (from the past 48 hour(s))  MRSA Next Gen by PCR, Nasal     Status: None   Collection Time: 04/28/2021  5:52 PM   Specimen: Nasal Mucosa; Nasal Swab  Result Value Ref Range   MRSA by PCR Next Gen NOT DETECTED NOT DETECTED    Comment: (NOTE) The GeneXpert MRSA Assay (FDA approved for NASAL specimens only), is one component of a comprehensive MRSA colonization surveillance program.  It is not intended to diagnose MRSA infection nor to guide or monitor treatment for MRSA infections. Test performance is not FDA approved in patients less than 17 years old. Performed at [facility], [place] [address], [place], [place] [address]   Glucose, capillary     Status: Abnormal   Collection Time: 04/12/2021  8:04 PM  Result Value Ref Range   Glucose-Capillary 153 (H) 70 - 99 mg/dL    Comment: Glucose reference range applies only to samples taken after fasting for at least 8 hours.  Lactic acid, plasma     Status: None   Collection Time: 05/02/2021  8:59 PM  Result Value Ref Range   Lactic Acid, Venous 1.6 0.5 - 1.9 mmol/L    Comment: Performed at [facility] [address], [place], [place] [address]  HIV Antibody (routine testing w rflx)     Status: None   Collection Time: 04/24/2021  8:59 PM  Result Value Ref Range   HIV Screen 4th Generation wRfx Non Reactive Non Reactive    Comment: Performed at [facility], [place] [address], [place], [place] [address]  Hepatitis B surface antibody     Status: Abnormal   Collection Time: 04/16/2021 11:33 PM  Result Value Ref Range   Hepatitis B-Post <3.1 (L) Immunity>9.9 mIU/mL    Comment: (NOTE)  Status of Immunity                     Anti-HBs Level  ------------------                     -------------- Inconsistent with Immunity                   0.0 - 9.9 Consistent with Immunity                          >9.9 Performed At: [facility] [place], [place] [id number] Rush Farmer MD WU:[id number]   Hepatitis B surface antigen     Status: None   Collection Time: 04/10/2021 11:59 PM  Result Value Ref Range   Hepatitis B Surface Ag NON REACTIVE NON REACTIVE    Comment: Performed at [facility] [address], [place], [place] [address]  Glucose, capillary     Status: None   Collection Time: 05/01/21 12:49 AM  Result Value Ref Range   Glucose-Capillary 96 70 - 99 mg/dL    Comment: Glucose  reference range applies only to samples taken after fasting for at least 8 hours.  CBC     Status: Abnormal   Collection Time: 05/01/21  1:20 AM  Result Value Ref Range   WBC 17.3 (H)  4.0 - 10.5 K/uL   RBC 2.83 (L) 4.22 - 5.81 MIL/uL   Hemoglobin 8.7 (L) 13.0 - 17.0 g/dL   HCT 25.0 (L) 39.0 - 52.0 %   MCV 88.3 80.0 - 100.0 fL   MCH 30.7 26.0 - 34.0 pg   MCHC 34.8 30.0 - 36.0 g/dL   RDW 13.1 11.5 - 15.5 %   Platelets 190 150 - 400 K/uL   nRBC 0.0 0.0 - 0.2 %    Comment: Performed at [facility], [place] [address] [place], [place] [address]  Comprehensive metabolic panel     Status: Abnormal   Collection Time: 05/01/21  1:20 AM  Result Value Ref Range   Sodium 135 135 - 145 mmol/L   Potassium 3.4 (L) 3.5 - 5.1 mmol/L   Chloride 98 98 - 111 mmol/L   CO2 23 22 - 32 mmol/L   Glucose, Bld 96 70 - 99 mg/dL    Comment: Glucose reference range applies only to samples taken after fasting for at least 8 hours.   BUN 38 (H) 6 - 20 mg/dL   Creatinine, Ser 6.72 (H) 0.61 - 1.24 mg/dL    Comment: DIALYSIS   Calcium 8.6 (L) 8.9 - 10.3 mg/dL   Total Protein 7.9 6.5 - 8.1 g/dL   Albumin 3.7 3.5 - 5.0 g/dL   AST 56 (H) 15 - 41 U/L   ALT 55 (H) 0 - 44 U/L   Alkaline Phosphatase 99 38 - 126 U/L   Total Bilirubin 1.2 0.3 - 1.2 mg/dL   GFR, Estimated 9 (L) >60 mL/min    Comment: (NOTE) Calculated using the CKD-EPI Creatinine Equation (2021)    Anion gap 14 5 - 15    Comment: Performed at [facility] [address], [place], [place] [address]  Triglycerides     Status: None   Collection Time: 05/01/21  1:20 AM  Result Value Ref Range   Triglycerides 96 <150 mg/dL    Comment: Performed at [facility] [address] [place], [place] [address]  Glucose, capillary     Status: None   Collection Time: 05/01/21  3:29 AM  Result Value Ref Range   Glucose-Capillary 95 70 - 99 mg/dL    Comment: Glucose reference range applies only to samples taken after fasting for at least 8  hours.  Glucose, capillary     Status: Abnormal   Collection Time: 05/01/21  8:29 AM  Result Value Ref Range   Glucose-Capillary 108 (H) 70 - 99 mg/dL    Comment: Glucose reference range applies only to samples taken after fasting for at least 8 hours.  Glucose, capillary     Status: Abnormal   Collection Time: 05/01/21 12:07 PM  Result Value Ref Range   Glucose-Capillary 119 (H) 70 - 99 mg/dL    Comment: Glucose reference range applies only to samples taken after fasting for at least 8 hours.  Troponin I (High Sensitivity)     Status: Abnormal   Collection Time: 05/01/21 12:34 PM  Result Value Ref Range   Troponin I (High Sensitivity) 296 (HH) <18 ng/L    Comment: CRITICAL VALUE NOTED.  VALUE IS CONSISTENT WITH PREVIOUSLY REPORTED AND CALLED VALUE. (NOTE) Elevated high sensitivity troponin I (hsTnI) values and significant  changes across serial measurements may suggest ACS but many other  chronic and acute conditions are known to elevate hsTnI results.  Refer to the Links section for chest pain algorithms and additional  guidance. Performed at [facility], [place] [address], [place], [place] [address]   Magnesium     Status: None   Collection Time: 05/01/21  1:56 PM  Result Value Ref Range   Magnesium 2.0 1.7 - 2.4 mg/dL    Comment: Performed at [facility] [address], [place], [place] [address]  Phosphorus     Status: Abnormal   Collection Time: 05/01/21  1:56 PM  Result Value Ref Range   Phosphorus 6.5 (H) 2.5 - 4.6 mg/dL    Comment: Performed at [facility] [address], [place], [place] [address]  Glucose, capillary     Status: Abnormal   Collection Time: 05/01/21  4:46 PM  Result Value Ref Range   Glucose-Capillary 141 (H) 70 - 99 mg/dL    Comment: Glucose reference range applies only to samples taken after fasting for at least 8 hours.  Magnesium     Status: None   Collection Time: 05/01/21  5:52 PM  Result Value Ref Range   Magnesium 2.0  1.7 - 2.4 mg/dL    Comment: Performed at [facility], [place] [address] [place], [place] [address]  Phosphorus     Status: Abnormal   Collection Time: 05/01/21  5:52 PM  Result Value Ref Range   Phosphorus 7.5 (H) 2.5 - 4.6 mg/dL    Comment: Performed at [facility] [address], [place], [place] [address]  Glucose, capillary     Status: Abnormal   Collection Time: 05/01/21  7:28 PM  Result Value Ref Range   Glucose-Capillary 157 (H) 70 - 99 mg/dL    Comment: Glucose reference range applies only to samples taken after fasting for at least 8 hours.  Glucose, capillary     Status: Abnormal   Collection Time: 05/01/21 11:50 PM  Result Value Ref Range   Glucose-Capillary 105 (H) 70 - 99 mg/dL    Comment: Glucose reference range applies only to samples taken after fasting for at least 8 hours.  Culture, Respiratory w Gram Stain     Status: None (Preliminary result)   Collection Time: 05/02/21  4:04 AM   Specimen: Tracheal Aspirate; Respiratory  Result Value Ref Range   Specimen Description TRACHEAL ASPIRATE    Special Requests NONE    Gram Stain      NO ORGANISMS SEEN SQUAMOUS EPITHELIAL CELLS PRESENT ABUNDANT WBC PRESENT,BOTH PMN AND MONONUCLEAR MODERATE GRAM POSITIVE COCCI RARE GRAM NEGATIVE RODS Performed at [facility], [place] [address] [place], [place] [address]    Culture PENDING    Report Status PENDING   Glucose, capillary     Status: Abnormal   Collection Time: 05/02/21  4:07 AM  Result Value Ref Range   Glucose-Capillary 134 (H) 70 - 99 mg/dL    Comment: Glucose reference range applies only to samples taken after fasting for at least 8 hours.  Magnesium     Status: None   Collection Time: 05/02/21  4:08 AM  Result Value Ref Range   Magnesium 2.1 1.7 - 2.4 mg/dL    Comment: Performed at [facility], [place] [address] [place], [place] [address]  Phosphorus     Status: Abnormal   Collection Time: 05/02/21  4:08 AM  Result Value Ref Range    Phosphorus 7.8 (H) 2.5 - 4.6 mg/dL    Comment: Performed at [facility] [address], [place], [place] [address]  CBC  Status: Abnormal   Collection Time: 05/02/21  4:08 AM  Result Value Ref Range   WBC 17.1 (H) 4.0 - 10.5 K/uL   RBC 2.31 (L) 4.22 - 5.81 MIL/uL   Hemoglobin 7.2 (L) 13.0 - 17.0 g/dL   HCT 21.4 (L) 39.0 - 52.0 %   MCV 92.6 80.0 - 100.0 fL   MCH 31.2 26.0 - 34.0 pg   MCHC 33.6 30.0 - 36.0 g/dL   RDW 13.6 11.5 - 15.5 %   Platelets 154 150 - 400 K/uL   nRBC 0.0 0.0 - 0.2 %    Comment: Performed at [facility], [place] [address] [place],  [address]  Comprehensive metabolic panel     Status: Abnormal   Collection Time: 05/02/21  4:08 AM  Result Value Ref Range   Sodium 134 (L) 135 - 145 mmol/L   Potassium 4.7 3.5 - 5.1 mmol/L    Comment: DELTA CHECK NOTED NO VISIBLE HEMOLYSIS    Chloride 98 98 - 111 mmol/L   CO2 22 22 - 32 mmol/L   Glucose, Bld 134 (H) 70 - 99 mg/dL    Comment: Glucose reference range applies only to samples taken after fasting for at least 8 hours.   BUN 66 (H) 6 - 20 mg/dL   Creatinine, Ser 11.44 (H) 0.61 - 1.24 mg/dL    Comment: DELTA CHECK NOTED   Calcium 7.6 (L) 8.9 - 10.3 mg/dL   Total Protein 6.3 (L) 6.5 - 8.1 g/dL   Albumin 2.8 (L) 3.5 - 5.0 g/dL   AST 36 15 - 41 U/L   ALT 34 0 - 44 U/L   Alkaline Phosphatase 89 38 - 126 U/L   Total Bilirubin 1.1 0.3 - 1.2 mg/dL   GFR, Estimated 5 (L) >60 mL/min    Comment: (NOTE) Calculated using the CKD-EPI Creatinine Equation (2021)    Anion gap 14 5 - 15    Comment: Performed at [facility] [address], [place], [place] [address]  Glucose, capillary     Status: Abnormal   Collection Time: 05/02/21  8:12 AM  Result Value Ref Range   Glucose-Capillary 117 (H) 70 - 99 mg/dL    Comment: Glucose reference range applies only to samples taken after fasting for at least 8 hours.  Glucose, capillary     Status: Abnormal   Collection Time: 05/02/21 10:38 AM  Result  Value Ref Range   Glucose-Capillary 104 (H) 70 - 99 mg/dL    Comment: Glucose reference range applies only to samples taken after fasting for at least 8 hours.  Troponin I (High Sensitivity)     Status: Abnormal   Collection Time: 05/02/21 11:09 AM  Result Value Ref Range   Troponin I (High Sensitivity) 164 (HH) <18 ng/L    Comment: CRITICAL VALUE NOTED.  VALUE IS CONSISTENT WITH PREVIOUSLY REPORTED AND CALLED VALUE. (NOTE) Elevated high sensitivity troponin I (hsTnI) values and significant  changes across serial measurements may suggest ACS but many other  chronic and acute conditions are known to elevate hsTnI results.  Refer to the Links section for chest pain algorithms and additional  guidance. Performed at [facility], [place] [address] [place], [place] [address]   Glucose, capillary     Status: Abnormal   Collection Time: 05/02/21  4:12 PM  Result Value Ref Range   Glucose-Capillary 130 (H) 70 - 99 mg/dL    Comment: Glucose reference range applies only to samples taken after  fasting for at least 8 hours.    MICRO:  IMAGING: CT HEAD WO CONTRAST (5MM)  Result Date: 05/04/2021 CLINICAL DATA:  Mental status change EXAM: CT HEAD WITHOUT CONTRAST TECHNIQUE: Contiguous axial images were obtained from the base of the skull through the vertex without intravenous contrast. COMPARISON:  01/17/2021 FINDINGS: Brain: No evidence of acute infarction, hemorrhage, hydrocephalus, extra-axial collection or mass lesion/mass effect. Vascular: No hyperdense vessels. Scattered calcifications in the intracranial carotids. Skull: Normal. Negative for fracture or focal lesion. Sinuses/Orbits: Shrunken right globe, with calcium deposits and increased density in the sclera and retina, which is new compared to the prior exam. Normal left globe. Partial opacification of the ethmoid air cells and hypoplastic left greater than right sphenoid sinus, with mild mucosal thickening in the maxillary sinuses.  Air-fluid level in the nasal cavity. Other: Trace fluid in bilateral mastoid air cells. Redemonstrated subcutaneous nodules in the scalp, most likely sebaceous cyst. IMPRESSION: 1. No acute intracranial process. 2. 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 Correlate with physical exam. 3. Air-fluid level in the nasal cavity and partial opacification of the paranasal sinuses, which is not uncommon in the setting of intubation. Electronically Signed   By: Merilyn Baba M.D.   On: 05/09/2021 18:42   DG Chest Port 1 View  Result Date: 05/02/2021 CLINICAL DATA:  Central line placement EXAM: PORTABLE CHEST 1 VIEW COMPARISON:  05/01/2021 FINDINGS: Endotracheal tube with tip measuring 2.9 cm above the carina. Enteric tube is present. Tip is off the field of view but below the left hemidiaphragm. Right central venous catheter with tip over the cavoatrial junction region. No pneumothorax. Shallow inspiration. Cardiac enlargement. Patchy infiltrates in the left upper lung and right base. No pleural effusions. IMPRESSION: Appliances appear in satisfactory position. Cardiac enlargement. Patchy infiltrates in the left upper lung and right base. Electronically Signed   By: Lucienne Capers M.D.   On: 05/02/2021 01:46   DG CHEST PORT 1 VIEW  Result Date: 05/01/2021 CLINICAL DATA:  Endotracheal tube present, acute respiratory failure, cardiac arrest EXAM: PORTABLE CHEST 1 VIEW COMPARISON:  05/04/2021 FINDINGS: Endotracheal and enteric tubes are again identified. Endotracheal tube has been retracted. Lung volumes are again low. Improved lung aeration. There is left basilar atelectasis. No significant pleural effusion. No pneumothorax. Similar cardiomediastinal contours. IMPRESSION: Endotracheal tube has been retracted. Lung aeration is improved. Left basilar atelectasis. Electronically Signed   By: Macy Mis M.D.   On: 05/01/2021 08:37    EEG adult  Result Date: 05/01/2021 Lora Havens, MD     05/01/2021  8:55 AM Patient Name: Danny Patrick. MRN: [id number] Epilepsy Attending: Lora Havens Referring Physician/Provider: Noe Gens, NP Date: 04/25/2021 Duration: 22.22 mins Patient history: 60 year old male status post cardiac arrest noted to have seizure-like episodes.  EEG to evaluate for seizures. Level of alertness: comatose AEDs during EEG study: Propofol Technical aspects: This EEG study was done with scalp electrodes positioned according to the 10-20 International system of electrode placement. Electrical activity was acquired at a sampling rate of $Remov'500Hz'adxrXB$  and reviewed with a high frequency filter of $RemoveB'70Hz'vyPLMNHv$  and a low frequency filter of $RemoveB'1Hz'TKvXAxqc$ . EEG data were recorded continuously and digitally stored. Description: EEG showed continuous generalized low amplitude 2 to 3 Hz delta slowing. Patient was noted to have intermittent episodes of brief whole body jerks. Concomitant EEG showed generalized polyspikes consistent with myoclonic seizure.  Hyperventilation and photic stimulation were  not performed.   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  With history of cardiac arrest, this is likely secondary to anoxic/hypoxic brain injury. Priyanka Barbra Sarks   Overnight EEG with video  Result Date: 05/01/2021 Lora Havens, MD     05/02/2021  9:05 AM Patient Name: Danny Patrick. MRN: [id number] Epilepsy Attending: Lora Havens Referring Physician/Provider: Noe Gens, NP Duration: 04/11/2021 1723 to 05/01/2021 1723  Patient history: 60 year old male status post cardiac arrest noted to have seizure-like episodes.  EEG to evaluate for seizures.  Level of alertness: comatose  AEDs during EEG study: Propofol  Technical aspects: This EEG study was done with scalp electrodes positioned  according to the 10-20 International system of electrode placement. Electrical activity was acquired at a sampling rate of 500Hz  and reviewed with a high frequency filter of 70Hz  and a low frequency filter of 1Hz . EEG data were recorded continuously and digitally stored.  Description: EEG initially showed continuous generalized low amplitude 2 to 3 Hz delta slowing. Patient was noted to have intermittent episodes of brief whole body jerks, once every few minutes. Concomitant EEG showed generalized polyspikes consistent with myoclonic seizure. After around 1900 on 04/10/2021 as sedation was adjusted, myoclonic seizures improved. EEG then showed burst suppression pattern with burst of high amplitude sharply contoured 3-5Hz  theta-delta slowing lasting 1-2 seconds and eeg suppression lasting 2-5 seconds. Hyperventilation and photic stimulation were not performed.    ABNORMALITY - Myoclonic seizure, generalized - Continuous slow, generalized - Burst suppression, generalized  IMPRESSION: This study initially showed myoclonic seizures with generalized onset, once every few minutes as well as severe encephalopathy.  After around 1900 on 05/01/2021 as sedation was adjusted, myoclonic seizures improved and eeg was suggestive of profound diffuse encephalopathy. With history of cardiac arrest, this is likely secondary to anoxic/hypoxic brain injury.  Lora Havens     Assessment/Plan:  60yo M with ESRD sustained at home witness cardiac arrest with severe encephalopathy from anoxic injury remains critically ill, intubated, pressors while being treated for staph bacteremia and possible aspiration pneumonia.  - has had repeat blood cx drawn today to see if bacteremia is still present, will follow on 8/24 cultures - continue on ceftriaxone but once finished with aspiration PNA recommend to narrow to cefazolin - most cautious approach is to treat as bacteremia; difficult to say that it is contaminant given patient  presentation, and unable to get history to whether he was having subjective fever and chills. - condition guarded overall, will continue to provider further recs as more micro info becomes available.  Elzie Rings [facility] [phone]

## 2021-05-02 NOTE — Procedures (Addendum)
Patient Name: Armahn Trierweiler.  MRN: [id number]  Epilepsy Attending: Lora Havens  Referring Physician/Provider: Noe Gens, NP Duration: 05/01/2021 1723 to 05/02/2021 1723   Patient history: 60 year old male status post cardiac arrest noted to have seizure-like episodes.  EEG to evaluate for seizures.   Level of alertness: comatose   AEDs during EEG study: Propofol   Technical aspects: This EEG study was done with scalp electrodes positioned according to the 10-20 International system of electrode placement. Electrical activity was acquired at a sampling rate of '500Hz'$  and reviewed with a high frequency filter of '70Hz'$  and a low frequency filter of '1Hz'$ . EEG data were recorded continuously and digitally stored.    Description: EEG initially showed continuous generalized predominantly 6 to 8 Hz theta and alpha activity as well as intermittent generalized 2 to 3 Hz delta slowing.  Gradually after around midnight on 05/02/2021, EEG showed continuous generalized background attenuation.  ABNORMALITY -Continuous slow, generalized -Background attenuation, generalized   IMPRESSION: This study is suggestive of severe to profound diffuse encephalopathy, nonspecific etiology.  No seizures or definite epileptiform discharges were seen during the study.  Gilberto Stanforth Barbra Sarks

## 2021-05-03 ENCOUNTER — Inpatient Hospital Stay (HOSPITAL_COMMUNITY): Payer: Medicare Other

## 2021-05-03 DIAGNOSIS — R569 Unspecified convulsions: Secondary | ICD-10-CM | POA: Diagnosis not present

## 2021-05-03 DIAGNOSIS — R609 Edema, unspecified: Secondary | ICD-10-CM | POA: Diagnosis not present

## 2021-05-03 DIAGNOSIS — I469 Cardiac arrest, cause unspecified: Secondary | ICD-10-CM | POA: Diagnosis not present

## 2021-05-03 DIAGNOSIS — Z452 Encounter for adjustment and management of vascular access device: Secondary | ICD-10-CM | POA: Diagnosis not present

## 2021-05-03 LAB — GLUCOSE, CAPILLARY
Glucose-Capillary: 170 mg/dL — ABNORMAL HIGH (ref 70–99)
Glucose-Capillary: 183 mg/dL — ABNORMAL HIGH (ref 70–99)
Glucose-Capillary: 185 mg/dL — ABNORMAL HIGH (ref 70–99)
Glucose-Capillary: 201 mg/dL — ABNORMAL HIGH (ref 70–99)
Glucose-Capillary: 201 mg/dL — ABNORMAL HIGH (ref 70–99)
Glucose-Capillary: 205 mg/dL — ABNORMAL HIGH (ref 70–99)

## 2021-05-03 LAB — COMPREHENSIVE METABOLIC PANEL
ALT: 35 U/L (ref 0–44)
AST: 66 U/L — ABNORMAL HIGH (ref 15–41)
Albumin: 2.9 g/dL — ABNORMAL LOW (ref 3.5–5.0)
Alkaline Phosphatase: 128 U/L — ABNORMAL HIGH (ref 38–126)
Anion gap: 15 (ref 5–15)
BUN: 48 mg/dL — ABNORMAL HIGH (ref 6–20)
CO2: 22 mmol/L (ref 22–32)
Calcium: 8.1 mg/dL — ABNORMAL LOW (ref 8.9–10.3)
Chloride: 92 mmol/L — ABNORMAL LOW (ref 98–111)
Creatinine, Ser: 7.67 mg/dL — ABNORMAL HIGH (ref 0.61–1.24)
GFR, Estimated: 8 mL/min — ABNORMAL LOW (ref >60)
Glucose, Bld: 236 mg/dL — ABNORMAL HIGH (ref 70–99)
Potassium: 4.9 mmol/L (ref 3.5–5.1)
Sodium: 129 mmol/L — ABNORMAL LOW (ref 135–145)
Total Bilirubin: 1.1 mg/dL (ref 0.3–1.2)
Total Protein: 7.4 g/dL (ref 6.5–8.1)

## 2021-05-03 LAB — CBC
HCT: 26.3 % — ABNORMAL LOW (ref 39.0–52.0)
Hemoglobin: 8.6 g/dL — ABNORMAL LOW (ref 13.0–17.0)
MCH: 30.9 pg (ref 26.0–34.0)
MCHC: 32.7 g/dL (ref 30.0–36.0)
MCV: 94.6 fL (ref 80.0–100.0)
Platelets: 212 10*3/uL (ref 150–400)
RBC: 2.78 MIL/uL — ABNORMAL LOW (ref 4.22–5.81)
RDW: 13.9 % (ref 11.5–15.5)
WBC: 16.3 10*3/uL — ABNORMAL HIGH (ref 4.0–10.5)
nRBC: 0.1 % (ref 0.0–0.2)

## 2021-05-03 LAB — MAGNESIUM: Magnesium: 2.1 mg/dL (ref 1.7–2.4)

## 2021-05-03 LAB — PHOSPHORUS: Phosphorus: 6.8 mg/dL — ABNORMAL HIGH (ref 2.5–4.6)

## 2021-05-03 MED ORDER — MIDAZOLAM HCL 2 MG/2ML IJ SOLN
2.0000 mg | Freq: Once | INTRAMUSCULAR | Status: AC
  Administered 2021-05-03: 2 mg via INTRAVENOUS

## 2021-05-03 MED ORDER — FENTANYL CITRATE (PF) 2500 MCG/50ML IJ SOLN
0.0000 ug/h | Status: DC
  Administered 2021-05-03: 50 ug/h via INTRAVENOUS
  Filled 2021-05-03: qty 100

## 2021-05-03 MED ORDER — LEVETIRACETAM IN NACL 1000 MG/100ML IV SOLN
1000.0000 mg | INTRAVENOUS | Status: AC
  Administered 2021-05-03: 1000 mg via INTRAVENOUS
  Filled 2021-05-03: qty 100

## 2021-05-03 MED ORDER — MIDAZOLAM HCL 2 MG/2ML IJ SOLN
INTRAMUSCULAR | Status: AC
  Filled 2021-05-03: qty 2

## 2021-05-03 MED ORDER — LEVETIRACETAM IN NACL 1000 MG/100ML IV SOLN
1000.0000 mg | Freq: Every day | INTRAVENOUS | Status: DC
  Administered 2021-05-04 – 2021-05-05 (×2): 1000 mg via INTRAVENOUS
  Filled 2021-05-03 (×2): qty 100

## 2021-05-03 MED ORDER — LEVETIRACETAM IN NACL 500 MG/100ML IV SOLN
500.0000 mg | Freq: Two times a day (BID) | INTRAVENOUS | Status: DC
  Filled 2021-05-03: qty 100

## 2021-05-03 MED ORDER — ALBUTEROL SULFATE (2.5 MG/3ML) 0.083% IN NEBU
2.5000 mg | INHALATION_SOLUTION | RESPIRATORY_TRACT | Status: DC | PRN
  Administered 2021-05-03: 2.5 mg via RESPIRATORY_TRACT
  Filled 2021-05-03: qty 3

## 2021-05-03 NOTE — Procedures (Addendum)
Patient Name: Danny Patrick.  MRN: [id number]  Epilepsy Attending: Lora Havens  Referring Physician/Provider: Noe Gens, NP Duration: 05/02/2021 1723 to 05/03/2021 1723   Patient history: 60 year old male status post cardiac arrest noted to have seizure-like episodes.  EEG to evaluate for seizures.   Level of alertness: comatose   AEDs during EEG study: Propofol   Technical aspects: This EEG study was done with scalp electrodes positioned according to the 10-20 International system of electrode placement. Electrical activity was acquired at a sampling rate of '500Hz'$  and reviewed with a high frequency filter of '70Hz'$  and a low frequency filter of '1Hz'$ . EEG data were recorded continuously and digitally stored.    Description: EEG showed continuous generalized background attenuation.  EEG was not reactive to tactile stimulation.     ABNORMALITY -Background attenuation, generalized   IMPRESSION: This study is suggestive of profound diffuse encephalopathy, nonspecific etiology but given history of cardiac arrest is most likely due to anoxic/hypoxic brain injury.  No seizures or definite epileptiform discharges were seen during the study.   Kailah Pennel Barbra Sarks

## 2021-05-03 NOTE — Progress Notes (Signed)
Pt vent setting adjusted by NP at bedside. Due to vent dyssynchrony.

## 2021-05-03 NOTE — Progress Notes (Signed)
LTM maint complete - no skin breakdown under: FP2 F4 F8  Maintenance F8 [facility] monitored, Event button test confirmed by [facility].

## 2021-05-03 NOTE — Progress Notes (Signed)
Wasted 168m of fentanyl with jPsychologist, sport and exercise

## 2021-05-03 NOTE — Progress Notes (Signed)
LTM maintenance completed; no skin breakdown was seen. Event button tested.

## 2021-05-03 NOTE — Progress Notes (Signed)
CCM family communication  Pt son called, clinical updates provided RE weaning sedation and plans to assess neuro status    Eliseo Gum MSN, AGACNP-BC [place]

## 2021-05-03 NOTE — Progress Notes (Signed)
NAME:  Danny Sedeno., MRN:  [id number], DOB:  1961-08-06, LOS: 3 ADMISSION DATE:  04/14/2021, CONSULTATION DATE:  05/01/2021 REFERRING MD:  Dr. Tyrone Nine, CHIEF COMPLAINT:  Cardiac Arrest    History of Present Illness:  60 y/o M who presented to [facility] ER on 8/22 via EMS post cardiac arrest.   The patient recently moved to [facility] in the last two months.  He returned back to [facility] to go to kidney transplant classes on Thursday 04/26/21.  He reported he had been more short of breath recently with activity.  Family noted he was having to pick his legs up to get them into the car.  He missed HD on Friday 8/19 after traveling.  Family reports they were at home am of presentation.  The son left to go to the restroom for a couple of minutes and when he returned he noted the patient to have snoring respirations (pt snores when he sleeps per son and recognized the abnormal sounds). Family called 911 & initiated CPR.  Initial rhythm for EMS was PEA with a rate of 30.  He was given EPI x3.  His HR became elevated after EPI and patient was cardioverted per EMS.  He was bagged into the ER and intubated on arrival.  Received 30 mg etomidate, 100 mg rocuronium for intubation. Initial VBG 7.316, CO2 30.  ISTAT labs - Na 137, K 3.7, glucose 205, BUN 108, CR 16.6, ionized calcium 0.84, WBC 15.7, Hgb 8.2.  CXR showed a right mainstem intubation.  He had spontaneous respirations and was started on propofol.  Unfortunately, he suffered an additional arrest in the ER requiring ACLS x1 round.  He was treated with LR x1 L, & pan cultured.  UDS negative. UA with elevated glucose, negative ketones, protein >300, rare bacteria.  Post arrest ABG 7.134 / 54.3 / 128 / 18.2.  CXR showed right mainstem intubation (adjusted in ER), cardiomegaly and pulmonary edema.    Friend reports he does not normally have regular bowel movements.  Noted he had difficulty buttoning his pants on 8/21. He had COVID in 2020 and has been vaccinated.   PCCM called  for ICU admission.   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  Ventilator dyssynchrony requiring change to SIMV.  Interim History / Subjective:   Patient occasionally dyssynchronous with the ventilator with high peak pressures.  No seizure activity.  Objective   Blood pressure (!) 129/43, pulse 72, temperature 97.9 F (36.6 C), resp. rate 15, height '5\' 9"'$  (1.753 m), weight 114.3 kg, SpO2 100 %.    Vent Mode: PRVC;SIMV;PSV FiO2 (%):  [40 %-100 %] 40 % Set Rate:  [15 bmp-18 bmp] 15 bmp Vt Set:  [470 mL-560 mL] 470 mL PEEP:  [5 cmH20-8 cmH20] 5 cmH20 Pressure Support:  [1 cmH20] 1 cmH20 Plateau Pressure:  [20 cmH20-27 cmH20] 20 cmH20   Intake/Output Summary (Last 24 hours) at 05/03/2021 1813 Last data filed at 05/03/2021 1501 Gross per 24 hour  Intake 3230.06 ml  Output 5 ml  Net 3225.06 ml    Filed Weights   05/02/21 0745 05/02/21 1130 05/03/21 0443  Weight: 110.8 kg 107.3 kg 114.3 kg    General:  elderly appearing male in NAD on vent Neuro:  Sedated, R pupil irregular. L pupil reactive.  HEENT:  [place]/AT, No JVD noted Cardiovascular:  RRR, no MRG Lungs: Wheezing bilaterally. Vent dyssynchrony  Abdomen:  Soft, non-distended, non-tender. Musculoskeletal:  No acute deformity Skin:  Intact, MMM  Isoelectric continuous EEG tracing.  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  Observe for further myoclonic seizure activity. -Discontinue continuous EEG if no further seizures tomorrow morning. -Add bronchodilator.  Add fentanyl for ventilator synchrony. -Hemodialysis as per nephrology. -Continue ceftriaxone for 5 days for aspiration pneumonia. -Overall neurological prognosis remains guarded.  Recurrence of myoclonic seizures would portend a dismal prognosis.  Will await sedation washout for full neuro prognostication at 40 hours after propofol discontinued.  MRI at that time to guide further neuro prognostication.  Best Practice (right click and "Reselect all SmartList Selections" daily)  Diet/type: tubefeeds and NPO w/ meds via tube DVT prophylaxis: prophylactic heparin  GI prophylaxis: PPI Lines: Arterial Line Foley:  N/A Code Status:  full code  Friend Maryagnes Amos [phone] Son [place] [phone]  Labs   CBC: Recent Labs  Lab 05/04/2021 1039 04/25/2021 1040 04/14/2021 1229 04/10/2021 1516 05/01/21 0120 05/02/21 0408 05/03/21 0442  WBC 15.7*  --   --   --  17.3* 17.1* 16.3*  HGB 8.2*   < > 9.5* 9.5* 8.7* 7.2* 8.6*  HCT 25.0*   < > 28.0* 28.0* 25.0* 21.4* 26.3*  MCV 94.7  --   --   --  88.3 92.6 94.6  PLT 175  --   --   --  190 154 212   < > = values in this interval not displayed.     Basic Metabolic Panel: Recent Labs  Lab 04/11/2021 1039 04/22/2021 1040 05/02/2021 1229 04/15/2021 1516 05/01/21 0120 05/01/21 1356 05/01/21 1752 05/02/21 0408 05/03/21 0442  NA 139 137  138 140 137 135  --   --  134* 129*  K 3.9 3.7  3.8 3.6 4.5 3.4*  --   --  4.7 4.9  CL 104 105  --   --   98  --   --  98 92*  CO2 14*  --   --   --  23  --   --  22 22  GLUCOSE 207* 205*  --   --  96  --   --  134* 236*  BUN 98* 108*  --   --  38*  --   --  66* 48*  CREATININE 15.02* 16.60*  --   --  6.72*  --   --  11.44* 7.67*  CALCIUM 7.2*  --   --   --  8.6*  --   --  7.6* 8.1*  MG 2.3  --   --   --   --  2.0 2.0 2.1 2.1  PHOS 9.5*  --   --   --   --  6.5* 7.5* 7.8* 6.8*      CRITICAL CARE Performed by: Kipp Brood   Total critical care time: 40 minutes  Critical care time was exclusive of separately billable procedures and treating other patients.  Critical care was necessary to treat or prevent imminent or life-threatening deterioration.  Critical care was time spent personally by me on the following activities: development of treatment plan with patient and/or surrogate as well as nursing, discussions with consultants, evaluation of patient's response to treatment, examination of patient, obtaining history from patient or surrogate, ordering and performing treatments and interventions, ordering and review of laboratory studies, ordering and review of radiographic studies, pulse oximetry, re-evaluation of patient's condition and participation in multidisciplinary rounds.  Kipp Brood, MD [facility] ICU Physician [place]  Pager: [phone] Mobile: [phone] After hours: [phone].   05/03/2021 6:13 PM

## 2021-05-03 NOTE — Progress Notes (Signed)
  Ongoing vent dyssynchrony, some stacking, some trapping, high peak pressures.   Tried several different modes and adjustments within modes. Apneic on PSV/CPAP which was interesting.   Ended up on SIMV (PRVC) Decreased Vt to 470 Decreased FiO2 from 80% to 40%

## 2021-05-03 NOTE — Progress Notes (Signed)
[facility] Progress Note  Subjective: on vent   Vitals:   05/03/21 1145 05/03/21 1154 05/03/21 1158 05/03/21 1200  BP:  (!) 160/56    Pulse:  78    Resp: 11 20    Temp: 98.4 F (36.9 C) 98.4 F (36.9 C)  98.6 F (37 C)  TempSrc:      SpO2:  100% 100%   Weight:      Height:        Exam: Gen on vent, sedated, not responsive Sclera anicteric, throat w/ ETT No jvd or bruits Chest clear anterior/ lateral RRR no MRG Abd obese, soft, no ascites  GU normal male MS no joint effusions or deformity Ext no LE or UE edema, no wounds or ulcers Neuro is on vent, sedated L FA AVF+bruit        Home meds include renvela 800 tid, prns    CXR IMPRESSION: Endotracheal tube in the proximal right mainstem bronchus. Withdraw at least 2 cm to be above the carina. Cardiomegaly and pulmonary edema.      OP HD: transient at [facility] MWF - home unit is [facility] in [place], [place]    [phone])   4h  106.5kg   Heparin 25u/ kg   LFA AVF  - rocaltrol 0.5 ug tiw     Assessment/ Plan: Cardiac arrest - downtime 15-20 minutes, on vent Acute resp failure - initial CXR showed severe pulm edema, much better after HD.  Volume overload - down 6kg here except today's is up but doubt accuracy. Cont to lower volume as tolerated.  ESRD - HD since 2019. Is on HD in [place] MWF, visiting family here. Missed Friday HD last week. Had HD here 8/22 and 8/24. Next HD tomorrow.  BP - not sure if on BP lowering meds at home. BP's normal to high here Anemia ckd - Hb 9's here, follow MBD ckd - cont rocaltrol tiw, binder when eating      Rob Maydell Knoebel 05/03/2021, 2:53 PM   Recent Labs  Lab 05/02/21 0408 05/03/21 0442  K 4.7 4.9  BUN 66* 48*  CREATININE 11.44* 7.67*  CALCIUM 7.6* 8.1*  PHOS 7.8* 6.8*  HGB 7.2* 8.6*    Inpatient medications:  acetaminophen  650 mg Oral Q4H   Or   acetaminophen (TYLENOL) oral liquid 160 mg/5 mL  650 mg Per Tube Q4H   Or   acetaminophen  650 mg Rectal Q4H    busPIRone  30 mg Per Tube Q8H   calcitRIOL  0.5 mcg Per Tube Daily   chlorhexidine gluconate (MEDLINE KIT)  15 mL Mouth Rinse BID   Chlorhexidine Gluconate Cloth  6 each Topical Q0600   docusate  100 mg Per Tube BID   feeding supplement (PROSource TF)  45 mL Per Tube QID   fentaNYL (SUBLIMAZE) injection  50 mcg Intravenous Once   heparin  5,000 Units Subcutaneous Q8H   insulin aspart  0-9 Units Subcutaneous Q4H   mouth rinse  15 mL Mouth Rinse 10 times per day   pantoprazole (PROTONIX) IV  40 mg Intravenous QHS   polyethylene glycol  17 g Per Tube Daily    sodium chloride     cefTRIAXone (ROCEPHIN)  IV Stopped (05/03/21 0953)   feeding supplement (VITAL 1.5 CAL) 1,000 mL (05/02/21 1717)   fentaNYL infusion INTRAVENOUS 125 mcg/hr (05/03/21 1200)   [START ON 05/04/2021] levETIRAcetam     norepinephrine (LEVOPHED) Adult infusion 21 mcg/min (05/03/21 1200)   propofol (DIPRIVAN) infusion 30  mcg/kg/min (05/03/21 1200)   vancomycin Stopped (05/02/21 1816)   albuterol, fentaNYL (SUBLIMAZE) injection, heparin, labetalol, ondansetron (ZOFRAN) IV

## 2021-05-03 NOTE — Progress Notes (Signed)
Bilateral lower extremity venous duplex has been completed. Preliminary results can be found in CV Proc through chart review.   05/03/21 9:59 AM Carlos Levering RVT

## 2021-05-03 NOTE — Progress Notes (Signed)
Came to room d/t vent alarming, appears dyssynchronous  w/ vent, also noted desat to 85% w/ good waveform.  RN in room and aware.  Increased fio2 to 100%, sat improved to 93-95%.  Notified Eliseo Gum NP, RN aware.  Dr Lynetta Mare aware.

## 2021-05-04 DIAGNOSIS — R569 Unspecified convulsions: Secondary | ICD-10-CM | POA: Diagnosis not present

## 2021-05-04 DIAGNOSIS — R7881 Bacteremia: Secondary | ICD-10-CM | POA: Diagnosis not present

## 2021-05-04 DIAGNOSIS — I469 Cardiac arrest, cause unspecified: Secondary | ICD-10-CM | POA: Diagnosis not present

## 2021-05-04 DIAGNOSIS — J9601 Acute respiratory failure with hypoxia: Secondary | ICD-10-CM | POA: Diagnosis not present

## 2021-05-04 LAB — VANCOMYCIN, RANDOM: Vancomycin Rm: 22

## 2021-05-04 LAB — GLUCOSE, CAPILLARY
Glucose-Capillary: 219 mg/dL — ABNORMAL HIGH (ref 70–99)
Glucose-Capillary: 201 mg/dL — ABNORMAL HIGH (ref 70–99)
Glucose-Capillary: 188 mg/dL — ABNORMAL HIGH (ref 70–99)
Glucose-Capillary: 157 mg/dL — ABNORMAL HIGH (ref 70–99)
Glucose-Capillary: 203 mg/dL — ABNORMAL HIGH (ref 70–99)
Glucose-Capillary: 243 mg/dL — ABNORMAL HIGH (ref 70–99)

## 2021-05-04 LAB — CULTURE, RESPIRATORY W GRAM STAIN

## 2021-05-04 LAB — TRIGLYCERIDES: Triglycerides: 226 mg/dL — ABNORMAL HIGH (ref <150)

## 2021-05-04 MED ORDER — INSULIN ASPART 100 UNIT/ML IJ SOLN
4.0000 [IU] | INTRAMUSCULAR | Status: DC
  Administered 2021-05-04 – 2021-05-06 (×10): 4 [IU] via SUBCUTANEOUS

## 2021-05-04 MED ORDER — BACLOFEN 1 MG/ML ORAL SUSPENSION
2.5000 mg | Freq: Three times a day (TID) | ORAL | Status: DC
  Filled 2021-05-04: qty 2.5

## 2021-05-04 MED ORDER — HEPARIN SODIUM (PORCINE) 1000 UNIT/ML DIALYSIS: 2000.0000 [IU] | INTRAMUSCULAR | Status: DC | PRN

## 2021-05-04 MED ORDER — BACLOFEN 1 MG/ML ORAL SUSPENSION
2.5000 mg | Freq: Three times a day (TID) | ORAL | Status: DC
  Administered 2021-05-04 – 2021-05-06 (×6): 2.5 mg
  Filled 2021-05-04 (×9): qty 2.5

## 2021-05-04 MED ORDER — HEPARIN SODIUM (PORCINE) 1000 UNIT/ML DIALYSIS: 3000.0000 [IU] | INTRAMUSCULAR | Status: DC | PRN

## 2021-05-04 MED ORDER — VITAL 1.5 CAL PO LIQD
1000.0000 mL | ORAL | Status: DC
  Administered 2021-05-06: 1000 mL

## 2021-05-04 MED ORDER — HEPARIN SODIUM (PORCINE) 1000 UNIT/ML IJ SOLN
INTRAMUSCULAR | Status: AC
  Filled 2021-05-04: qty 4

## 2021-05-04 MED ORDER — CEFAZOLIN SODIUM-DEXTROSE 2-4 GM/100ML-% IV SOLN
2.0000 g | INTRAVENOUS | Status: AC
  Administered 2021-05-04: 2 g via INTRAVENOUS
  Filled 2021-05-04: qty 100

## 2021-05-04 NOTE — Plan of Care (Signed)

## 2021-05-04 NOTE — Progress Notes (Addendum)
Nutrition Follow Up  DOCUMENTATION CODES:   Not applicable  INTERVENTION:   Tube feeding:  -Vital 1.5 @ 60 ml/hr (1440 ml) via OG -ProSource 45 ml QID -Continue Renal MVI daily  Provides: 2320 kcals, 141 grams protein, 1100 ml free water.   NUTRITION DIAGNOSIS:   Increased nutrient needs related to acute illness as evidenced by estimated needs.  Ongoing  GOAL:   Patient will meet greater than or equal to 90% of their needs  Addressed via TF  MONITOR:   Vent status, Skin, TF tolerance, Weight trends, Labs, I & O's  REASON FOR ASSESSMENT:   Ventilator    ASSESSMENT:   Patient with PMH significant for ESRD on HD, DM, CHF, HTN, and recent COVID infection 2020. Presents this admission with out of hospital cardiac arrest and volume overload after missing HD session.  Pt discussed during ICU rounds and with RN.   On levophed. Undergoing HD. Propofol and fentanyl stopped in attempt to assess neuro status. Plan for MRI tomorrow. Had BM. Tolerating tube feeding at goal. Adjust rate to better meet needs now patient is of propofol.   EDW: 106.5 kg  Current weight: 121.7 kg   UOP: 5 ml x 24 hrs   Drips: levophed Medications: calcitriol, colace, SS novolog, miralax  Labs: Na 129 (L) Phosphorus 6.8 (H) CBG 164-205  Diet Order:   Diet Order     None       EDUCATION NEEDS:   Not appropriate for education at this time  Skin:  Skin Assessment: Reviewed RN Assessment  Last BM:  8/26  Height:   Ht Readings from Last 1 Encounters:  05/03/2021 '5\' 9"'$  (1.753 m)    Weight:   Wt Readings from Last 1 Encounters:  05/04/21 121.7 kg    BMI:  Body mass index is 39.62 kg/m.  Estimated Nutritional Needs:   Kcal:  2100-2300 kcal  Protein:  130-150 grams  Fluid:  1000 ml + UOP   Lorien Shingler MS, RD, LDN, CNSC Clinical Nutrition Pager listed in [place]

## 2021-05-04 NOTE — Procedures (Addendum)
Patient Name: Danny Patrick.  MRN: [id number]  Epilepsy Attending: Lora Havens  Referring Physician/Provider: Noe Gens, NP Duration: 05/03/2021 1723 to 05/04/2021 1723   Patient history: 60 year old male status post cardiac arrest noted to have seizure-like episodes.  EEG to evaluate for seizures.   Level of alertness: comatose   AEDs during EEG study: Propofol   Technical aspects: This EEG study was done with scalp electrodes positioned according to the 10-20 International system of electrode placement. Electrical activity was acquired at a sampling rate of '500Hz'$  and reviewed with a high frequency filter of '70Hz'$  and a low frequency filter of '1Hz'$ . EEG data were recorded continuously and digitally stored.    Description: EEG showed continuous generalized background suppression.  EEG was not reactive to tactile stimulation.     ABNORMALITY -Background suppression, generalized   IMPRESSION: This study is suggestive of profound diffuse encephalopathy, nonspecific etiology but given history of cardiac arrest is most likely due to anoxic/hypoxic brain injury.  No seizures or definite epileptiform discharges were seen during the study.   Viviane Semidey Barbra Sarks

## 2021-05-04 NOTE — Progress Notes (Signed)
Danny Patrick Progress Note  Subjective: on vent , off pressors  Vitals:   05/04/21 1000 05/04/21 1100 05/04/21 1152 05/04/21 1153  BP: (!) 173/62 (!) 142/61 (!) 142/61   Pulse:   62   Resp: (!) _0 Temp: 98.1 F (36.7 C) (!) 97.5 F (36.4 C)    TempSrc:      SpO2:   100% 100%  Weight:      Height:        Exam: Gen on vent, sedated, not responsive Sclera anicteric, throat w/ ETT No jvd or bruits Chest clear anterior/ lateral RRR no MRG Abd obese, soft, no ascites  GU normal male MS no joint effusions or deformity Ext no LE or UE edema, no wounds or ulcers Neuro is on vent, sedated L FA AVF+bruit        Home meds include renvela 800 tid, prns    CXR IMPRESSION: Endotracheal tube in the proximal right mainstem bronchus. Withdraw at least 2 cm to be above the carina. Cardiomegaly and pulmonary edema.      OP HD: transient at [facility] MWF - home unit is [facility] in [place], [place]    [phone])   4h  106.5kg   Heparin 25u/ kg   LFA AVF  - rocaltrol 0.5 ug tiw     Assessment/ Plan: Cardiac arrest - downtime 15-20 minutes, on vent Acute resp failure - initial CXR showed severe pulm edema, imrpoved after HD.  Volume overload - had improved but by wt's and exam looking overloaded now. Max UF w/ levo support on HD today.  ESRD - HD since 2019. Is on HD in [place] MWF, visiting family here. Missed Friday HD last week. Had HD here 8/22 and 8/24. HD today.  BP - not sure if on BP lowering meds at home. BP's labile here.  Anemia ckd - Hb 9's here, follow MBD ckd - cont rocaltrol tiw, binder when eating      Danny Patrick 05/04/2021, 12:06 PM   Recent Labs  Lab 05/02/21 0408 05/03/21 0442  K 4.7 4.9  BUN 66* 48*  CREATININE 11.44* 7.67*  CALCIUM 7.6* 8.1*  PHOS 7.8* 6.8*  HGB 7.2* 8.6*    Inpatient medications:  acetaminophen  650 mg Oral Q4H   Or   acetaminophen (TYLENOL) oral liquid 160 mg/5 mL  650 mg Per Tube Q4H   Or   acetaminophen   650 mg Rectal Q4H   baclofen  2.5 mg Per Tube TID   busPIRone  30 mg Per Tube Q8H   calcitRIOL  0.5 mcg Per Tube Daily   chlorhexidine gluconate (MEDLINE KIT)  15 mL Mouth Rinse BID   Chlorhexidine Gluconate Cloth  6 each Topical Q0600   docusate  100 mg Per Tube BID   feeding supplement (PROSource TF)  45 mL Per Tube QID   fentaNYL (SUBLIMAZE) injection  50 mcg Intravenous Once   heparin  5,000 Units Subcutaneous Q8H   heparin sodium (porcine)       insulin aspart  4 Units Subcutaneous Q4H   mouth rinse  15 mL Mouth Rinse 10 times per day   pantoprazole (PROTONIX) IV  40 mg Intravenous QHS   polyethylene glycol  17 g Per Tube Daily    sodium chloride      ceFAZolin (ANCEF) IV     feeding supplement (VITAL 1.5 CAL) 1,000 mL (05/04/21 1040)   fentaNYL infusion INTRAVENOUS Stopped (05/04/21 7341)   levETIRAcetam  norepinephrine (LEVOPHED) Adult infusion 10 mcg/min (05/04/21 1100)   propofol (DIPRIVAN) infusion Stopped (05/04/21 0834)   albuterol, fentaNYL (SUBLIMAZE) injection, heparin, labetalol, ondansetron (ZOFRAN) IV

## 2021-05-04 NOTE — Progress Notes (Signed)
Pt was switched from PS/CPAP to SIMV(PRVC) mode due to apneic episodes. RT was called by RN. Pt is not in distress. Rt will continue to monitor.

## 2021-05-04 NOTE — Progress Notes (Signed)
[facility]    Date of Admission:  04/27/2021   Total days of antibiotics 5           ID: Danny Cothern. is a 60 y.o. male with PEA code, with anoxic brain injury being treated for aspiration pneumonia but also found to have + blood cx in 1/4 bottles (staph lugdunensis, staph capitis) Active Problems:   Cardiac arrest (HCC)   Acute respiratory failure with hypoxia (HCC)   Seizure (HCC)   ESRD on hemodialysis (HCC)    Subjective: Afebrile. Has been off of sedation, not responsive to verbal stimuli. Continues on LT EEG monitoring showing diffuse encephalopathy c/w anoxic brain injury, not having further seizures.  Remains on low dose vasopressor. Not needing warming blanket  Labs show repeat blood cx ngtd. Trach aspirate cx MSSA. Blood cx on admit showing s.lugdunensis and s. capitis  Medications:   heparin sodium (porcine)       acetaminophen  650 mg Oral Q4H   Or   acetaminophen (TYLENOL) oral liquid 160 mg/5 mL  650 mg Per Tube Q4H   Or   acetaminophen  650 mg Rectal Q4H   baclofen  2.5 mg Per Tube TID   busPIRone  30 mg Per Tube Q8H   calcitRIOL  0.5 mcg Per Tube Daily   chlorhexidine gluconate (MEDLINE KIT)  15 mL Mouth Rinse BID   Chlorhexidine Gluconate Cloth  6 each Topical Q0600   docusate  100 mg Per Tube BID   feeding supplement (PROSource TF)  45 mL Per Tube QID   fentaNYL (SUBLIMAZE) injection  50 mcg Intravenous Once   heparin  5,000 Units Subcutaneous Q8H   insulin aspart  4 Units Subcutaneous Q4H   mouth rinse  15 mL Mouth Rinse 10 times per day   pantoprazole (PROTONIX) IV  40 mg Intravenous QHS   polyethylene glycol  17 g Per Tube Daily    Objective: Vital signs in last 24 hours: Temp:  [97.2 F (36.2 C)-98.8 F (37.1 C)] 97.5 F (36.4 C) (08/26 1100) Pulse Rate:  [71-85] 71 (08/26 0752) Resp:  [9-30] 15 (08/26 1100) BP: (129-187)/(43-70) 142/61 (08/26 1100) SpO2:  [97 %-100 %] 100 % (08/26 0846) Arterial Line BP:  (77-189)/(35-98) 139/51 (08/26 1100) FiO2 (%):  [40 %-90 %] 40 % (08/26 0846) Weight:  [116.3 kg] 116.3 kg (08/26 0500) Physical Exam  Constitutional: non-responsive to verbal stimuli He appears well-developed and well-nourished. No distress.  HENT: no scleral icterus Mouth/Throat: OETT in place Cardiovascular: Normal rate, regular rhythm and normal heart sounds. Exam reveals no gallop and no friction rub.  No murmur heard.  Pulmonary/Chest: Effort normal and breath sounds normal. No respiratory distress. He has no wheezes.  Abdominal: Soft. Bowel sounds are decreased. He exhibits no distension. There is no tenderness.  Ext: pitting edema Neurological: non responsive.  Skin: Skin is warm and dry. No rash noted. No erythema.   Lab Results Recent Labs    05/02/21 0408 05/03/21 0442  WBC 17.1* 16.3*  HGB 7.2* 8.6*  HCT 21.4* 26.3*  NA 134* 129*  K 4.7 4.9  CL 98 92*  CO2 22 22  BUN 66* 48*  CREATININE 11.44* 7.67*   Liver Panel Recent Labs    05/02/21 0408 05/03/21 0442  PROT 6.3* 7.4  ALBUMIN 2.8* 2.9*  AST 36 66*  ALT 34 35  ALKPHOS 89 128*  BILITOT 1.1 1.1   Sedimentation Rate No results for input(s): ESRSEDRATE in  the last 72 hours. C-Reactive Protein No results for input(s): CRP in the last 72 hours.  Microbiology: 8/24 blood cx ngtd 8/24 trach aspirate MSSA 8/22 blood cx 1 set NGTD 8/22 blood cx 1 of 2 bottles with staph capitis and staph lugdunensis Studies/Results: VAS [place] LOWER EXTREMITY VENOUS (DVT)  Result Date: 05/03/2021  Lower Venous DVT Study Patient Name:  Danny Patrick.  Date of Exam:   05/03/2021 Medical Rec #: [id number]       Accession #:    [id number] Date of Birth: 05-06-1961       Patient Gender: M Patient Age:   40 years Exam Location:  [facility] Procedure:      VAS [place] LOWER EXTREMITY VENOUS (DVT) Referring Phys: Elease Etienne --------------------------------------------------------------------------------  Indications: Edema.  Risk  Factors: None identified. Limitations: Poor ultrasound/tissue interface, body habitus, orthopaedic appliance and patient positioning, patient immobility. Comparison Study: No prior studies. Performing Technologist: Oliver Hum RVT  Examination Guidelines: A complete evaluation includes B-mode imaging, spectral Doppler, color Doppler, and power Doppler as needed of all accessible portions of each vessel. Bilateral testing is considered an integral part of a complete examination. Limited examinations for reoccurring indications may be performed as noted. The reflux portion of the exam is performed with the patient in reverse Trendelenburg.  +---------+---------------+---------+-----------+----------+--------------+ RIGHT    CompressibilityPhasicitySpontaneityPropertiesThrombus Aging +---------+---------------+---------+-----------+----------+--------------+ CFV      Full           Yes      Yes                                 +---------+---------------+---------+-----------+----------+--------------+ SFJ      Full                                                        +---------+---------------+---------+-----------+----------+--------------+ FV Prox  Full                                                        +---------+---------------+---------+-----------+----------+--------------+ FV Mid   Full                                                        +---------+---------------+---------+-----------+----------+--------------+ FV DistalFull                                                        +---------+---------------+---------+-----------+----------+--------------+ PFV      Full                                                        +---------+---------------+---------+-----------+----------+--------------+ POP  Full           Yes      Yes                                 +---------+---------------+---------+-----------+----------+--------------+ PTV       Full                                                        +---------+---------------+---------+-----------+----------+--------------+ PERO     Full                                                        +---------+---------------+---------+-----------+----------+--------------+   +---------+---------------+---------+-----------+----------+--------------+ LEFT     CompressibilityPhasicitySpontaneityPropertiesThrombus Aging +---------+---------------+---------+-----------+----------+--------------+ CFV      Full           Yes      Yes                                 +---------+---------------+---------+-----------+----------+--------------+ SFJ      Full                                                        +---------+---------------+---------+-----------+----------+--------------+ FV Prox  Full                                                        +---------+---------------+---------+-----------+----------+--------------+ FV Mid   Full                                                        +---------+---------------+---------+-----------+----------+--------------+ FV DistalFull                                                        +---------+---------------+---------+-----------+----------+--------------+ PFV      Full                                                        +---------+---------------+---------+-----------+----------+--------------+ POP      Full           Yes      Yes                                 +---------+---------------+---------+-----------+----------+--------------+  PTV      Full                                                        +---------+---------------+---------+-----------+----------+--------------+ PERO     Full                                                        +---------+---------------+---------+-----------+----------+--------------+     Summary: RIGHT: - There is no evidence of deep  vein thrombosis in the lower extremity.  - No cystic structure found in the popliteal fossa.  LEFT: - There is no evidence of deep vein thrombosis in the lower extremity.  - No cystic structure found in the popliteal fossa.  *See table(s) above for measurements and observations. Electronically signed by Jamelle Haring on 05/03/2021 at 5:49:46 PM.    Final      Assessment/Plan: 60yo M with out of hospital PEA arrest with anoxic brain injury, concurrently being treated for aspiration pneumonia and possible bacteremia  Gram positive bacteremia = 2 different species found in 1 of 4 bottles, likely contaminant. Still will be covered by ceftriaxone for which he is received for aspiraton pna  Aspiration pneumonia = consider narrow to cefazolin due to cx showing MSSA  Overall prognosis is poor given multiorgan involvement, anoxic brain injury. Defer to PCCM for management and guiding discussion of goals of care.  Will sign off.  [facility] for Infectious Diseases Cell: [phone] Pager: [phone]  05/04/2021, 11:44 AM

## 2021-05-04 NOTE — Progress Notes (Signed)
NAME:  Emerick Matzinger., MRN:  [id number], DOB:  Jan 24, 1961, LOS: 4 ADMISSION DATE:  04/24/2021, CONSULTATION DATE:  04/29/2021 REFERRING MD:  Dr. Tyrone Nine, CHIEF COMPLAINT:  Cardiac Arrest    History of Present Illness:  60 y/o M who presented to [facility] ER on 8/22 via EMS post cardiac arrest.   The patient recently moved to [facility] in the last two months.  He returned back to [facility] to go to kidney transplant classes on Thursday 04/26/21.  He reported he had been more short of breath recently with activity.  Family noted he was having to pick his legs up to get them into the car.  He missed HD on Friday 8/19 after traveling.  Family reports they were at home am of presentation.  The son left to go to the restroom for a couple of minutes and when he returned he noted the patient to have snoring respirations (pt snores when he sleeps per son and recognized the abnormal sounds). Family called 911 & initiated CPR.  Initial rhythm for EMS was PEA with a rate of 30.  He was given EPI x3.  His HR became elevated after EPI and patient was cardioverted per EMS.  He was bagged into the ER and intubated on arrival.  Received 30 mg etomidate, 100 mg rocuronium for intubation. Initial VBG 7.316, CO2 30.  ISTAT labs - Na 137, K 3.7, glucose 205, BUN 108, CR 16.6, ionized calcium 0.84, WBC 15.7, Hgb 8.2.  CXR showed a right mainstem intubation.  He had spontaneous respirations and was started on propofol.  Unfortunately, he suffered an additional arrest in the ER requiring ACLS x1 round.  He was treated with LR x1 L, & pan cultured.  UDS negative. UA with elevated glucose, negative ketones, protein >300, rare bacteria.  Post arrest ABG 7.134 / 54.3 / 128 / 18.2.  CXR showed right mainstem intubation (adjusted in ER), cardiomegaly and pulmonary edema.    Friend reports he does not normally have regular bowel movements.  Noted he had difficulty buttoning his pants on 8/21. He had COVID in 2020 and has been vaccinated.   PCCM called  for ICU admission.   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  Ventilator dyssynchrony requiring change to SIMV.  Interim History / Subjective:   Propofol weaned down.  No further myoclonic activity.  Occasional ventilator dyssynchrony.  Better on SIMV.  Objective   Blood pressure (!) 155/58, pulse 71, temperature 98.8 F (37.1 C), resp. rate (!) 23, height '5\' 9"'$  (1.753 m), weight 116.3 kg, SpO2 100 %.    Vent Mode: SIMV;PSV;PRVC FiO2 (%):  [40 %-100 %] 40 % Set Rate:  [15 bmp-18 bmp] 15 bmp Vt Set:  [470 mL-560 mL] 470 mL PEEP:  [5 cmH20-8 cmH20] 5 cmH20 Pressure Support:  [1 cmH20-10 cmH20] 10 cmH20 Plateau Pressure:  [16 cmH20-25 cmH20] 16 cmH20   Intake/Output Summary (Last 24 hours) at 05/04/2021 0831 Last data filed at 05/04/2021 0700 Gross per 24 hour  Intake 2470.55 ml  Output 5 ml  Net 2465.55 ml    Filed Weights   05/02/21 1130  05/03/21 0443 05/04/21 0500  Weight: 107.3 kg 114.3 kg 116.3 kg    General:  elderly appearing male in NAD on vent Neuro: Off sedation, R pupil irregular. L pupil reactive.  Occasional mouthing and biting on tube.  No limb response to painful stimulation. HEENT:  [place]/AT, No JVD noted Cardiovascular:  RRR, no MRG Lungs: Chest clear.  Occasional ventilator dyssynchrony due to hiccups. Abdomen:  Soft, non-distended, non-tender. Musculoskeletal:  No acute deformity Skin:  Intact, MMM  Isoelectric continuous EEG tracing.  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 -Discontinue continuous EEG this afternoon if no seizure activity. -Switched to PSV ventilation.  Will tolerate occasional dyssynchrony as not impacting oxygenation. -Added short low-dose course of baclofen for hiccups. -Hemodialysis as per nephrology. -Taper down antibiotics to cefazolin for Staph aureus in sputum.  Total 5-day course. -Overall neurological prognosis remains guarded.  Recurrence of myoclonic seizures would portend a dismal prognosis.  Will await sedation washout for full neuro prognostication at 40 hours after propofol discontinued.  Plan for MRI tomorrow.  Best Practice (right click and "Reselect all SmartList Selections" daily)  Diet/type: tubefeeds and NPO w/ meds via tube DVT prophylaxis: prophylactic heparin  GI prophylaxis: PPI Lines: Arterial Line Foley:  N/A Code Status:  full code  Friend Maryagnes Amos [phone] Son Susie Bubel [phone]  Labs   CBC: Recent Labs  Lab 04/29/2021 1039 04/23/2021 1040 05/09/2021 1229 04/11/2021 1516 05/01/21 0120 05/02/21 0408 05/03/21 0442  WBC 15.7*  --   --   --  17.3* 17.1* 16.3*  HGB 8.2*   < > 9.5* 9.5* 8.7* 7.2* 8.6*  HCT 25.0*   < > 28.0* 28.0* 25.0* 21.4* 26.3*  MCV 94.7  --   --   --  88.3 92.6 94.6  PLT 175  --   --   --  190 154 212   < > = values in this interval not displayed.     Basic Metabolic Panel: Recent Labs  Lab 04/28/2021 1039 04/22/2021 1040 04/14/2021 1229 04/09/2021 1516 05/01/21 0120 05/01/21 1356  05/01/21 1752 05/02/21 0408 05/03/21 0442  NA 139 137  138 140 137 135  --   --  134* 129*  K 3.9 3.7  3.8 3.6 4.5 3.4*  --   --  4.7 4.9  CL 104 105  --   --  98  --   --  98 92*  CO2 14*  --   --   --  23  --   --  22 22  GLUCOSE 207* 205*  --   --  96  --   --  134* 236*  BUN 98* 108*  --   --  38*  --   --  66* 48*  CREATININE 15.02* 16.60*  --   --  6.72*  --   --  11.44* 7.67*  CALCIUM 7.2*  --   --   --  8.6*  --   --  7.6* 8.1*  MG 2.3  --   --   --   --  2.0 2.0 2.1 2.1  PHOS 9.5*  --   --   --   --  6.5* 7.5* 7.8* 6.8*      CRITICAL CARE Performed by: Kipp Brood   Total critical care time: 40 minutes  Critical care time was exclusive of separately billable procedures and treating other patients.  Critical care was necessary to treat or prevent imminent or life-threatening deterioration.  Critical care was time spent personally by me on the following activities: development of treatment plan with patient and/or surrogate as well as nursing, discussions with consultants, evaluation of patient's response to treatment, examination of patient, obtaining history from patient or surrogate, ordering and performing treatments and interventions, ordering and review of laboratory studies, ordering and review of radiographic studies, pulse oximetry, re-evaluation of patient's condition and participation in multidisciplinary rounds.  Kipp Brood, MD [facility] ICU Physician [place]  Pager: [phone] Mobile: [phone] After hours: [phone].   05/04/2021 8:31 AM

## 2021-05-04 NOTE — Progress Notes (Signed)
LTM maint complete - no skin breakdown under: Fp1 Fp2 F3 F7  M[facility] monitored, Event button test confirmed by [facility].

## 2021-05-05 ENCOUNTER — Inpatient Hospital Stay (HOSPITAL_COMMUNITY): Payer: Medicare Other

## 2021-05-05 DIAGNOSIS — R7881 Bacteremia: Secondary | ICD-10-CM | POA: Diagnosis not present

## 2021-05-05 DIAGNOSIS — R569 Unspecified convulsions: Secondary | ICD-10-CM | POA: Diagnosis not present

## 2021-05-05 DIAGNOSIS — J9601 Acute respiratory failure with hypoxia: Secondary | ICD-10-CM | POA: Diagnosis not present

## 2021-05-05 DIAGNOSIS — I469 Cardiac arrest, cause unspecified: Secondary | ICD-10-CM | POA: Diagnosis not present

## 2021-05-05 LAB — GLUCOSE, CAPILLARY
Glucose-Capillary: 259 mg/dL — ABNORMAL HIGH (ref 70–99)
Glucose-Capillary: 244 mg/dL — ABNORMAL HIGH (ref 70–99)
Glucose-Capillary: 215 mg/dL — ABNORMAL HIGH (ref 70–99)
Glucose-Capillary: 175 mg/dL — ABNORMAL HIGH (ref 70–99)
Glucose-Capillary: 142 mg/dL — ABNORMAL HIGH (ref 70–99)
Glucose-Capillary: 216 mg/dL — ABNORMAL HIGH (ref 70–99)

## 2021-05-05 LAB — CULTURE, BLOOD (ROUTINE X 2)
Culture: NO GROWTH
Special Requests: ADEQUATE
Special Requests: ADEQUATE

## 2021-05-05 MED ORDER — EPINEPHRINE 1 MG/10ML IJ SOSY
PREFILLED_SYRINGE | INTRAMUSCULAR | Status: AC
  Filled 2021-05-05: qty 10

## 2021-05-05 NOTE — Progress Notes (Signed)
NAME:  Danny Zahra., MRN:  [id number], DOB:  October 11, 1960, LOS: 5 ADMISSION DATE:  04/27/2021, CONSULTATION DATE:  05/02/2021 REFERRING MD:  Dr. Tyrone Nine, CHIEF COMPLAINT:  Cardiac Arrest    History of Present Illness:  60 y/o M who presented to [facility] ER on 8/22 via EMS post cardiac arrest.   The patient recently moved to [facility] in the last two months.  He returned back to [facility] to go to kidney transplant classes on Thursday 04/26/21.  He reported he had been more short of breath recently with activity.  Family noted he was having to pick his legs up to get them into the car.  He missed HD on Friday 8/19 after traveling.  Family reports they were at home am of presentation.  The son left to go to the restroom for a couple of minutes and when he returned he noted the patient to have snoring respirations (pt snores when he sleeps per son and recognized the abnormal sounds). Family called 911 & initiated CPR.  Initial rhythm for EMS was PEA with a rate of 30.  He was given EPI x3.  His HR became elevated after EPI and patient was cardioverted per EMS.  He was bagged into the ER and intubated on arrival.  Received 30 mg etomidate, 100 mg rocuronium for intubation. Initial VBG 7.316, CO2 30.  ISTAT labs - Na 137, K 3.7, glucose 205, BUN 108, CR 16.6, ionized calcium 0.84, WBC 15.7, Hgb 8.2.  CXR showed a right mainstem intubation.  He had spontaneous respirations and was started on propofol.  Unfortunately, he suffered an additional arrest in the ER requiring ACLS x1 round.  He was treated with LR x1 L, & pan cultured.  UDS negative. UA with elevated glucose, negative ketones, protein >300, rare bacteria.  Post arrest ABG 7.134 / 54.3 / 128 / 18.2.  CXR showed right mainstem intubation (adjusted in ER), cardiomegaly and pulmonary edema.    Friend reports he does not normally have regular bowel movements.  Noted he had difficulty buttoning his pants on 8/21. He had COVID in 2020 and has been vaccinated.   PCCM called  for ICU admission.   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  Ventilator dyssynchrony requiring change to SIMV. 8/26 all sedation fully stopped.   Interim History / Subjective:   Labile blood pressure, on and off NE. Patient has started to have episodes of jerking movements.   Objective   Blood pressure (!) 102/31, pulse 64, temperature (!) 97.2 F (36.2 C), resp. rate (!) 29, height '5\' 9"'$  (1.753 m), weight 117.5 kg, SpO2 93 %.    Vent Mode: SIMV;PRVC;PSV FiO2 (%):  [40 %] 40 % Set Rate:  [15 bmp] 15 bmp Vt Set:  [470 mL] 470 mL PEEP:  [5 cmH20] 5 cmH20 Pressure Support:  [10 cmH20] 10 cmH20   Intake/Output Summary (Last 24 hours) at 05/05/2021 1259 Last data filed at 05/05/2021 0600 Gross per 24 hour  Intake 789.22 ml  Output 3500 ml  Net -2710.78 ml    Filed Weights   05/04/21 1130 05/04/21 1545 05/05/21 0500  Weight: 121.7 kg 117.1 kg 117.5 kg    General:  elderly appearing male in NAD on vent Neuro: Off sedation, R pupil irregular. L pupil reactive. No response to pain. Rhythymic jerking of the right arm, occasional oral twitching, more with stimulation.  HEENT:  [place]/AT, No JVD noted Cardiovascular:  RRR, no MRG Lungs: Chest clear.  Occasional ventilator dyssynchrony due to hiccups. Abdomen:  Soft, non-distended, non-tender. Musculoskeletal:  No acute deformity Skin:  Intact, MMM  Isoelectric continuous EEG tracing.  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 - Titrate propofol to clinical seizure control, will stop continuous EEG. EEG is still essentially flat suggesting spinal myoclonus.  - MRI to help with neuro prognostication.   I have updated the son this morning and informed him that the recurrence of myoclonic seizures portends a very poor prognosis and that we would communicate again once the results of the MRI were available.   Best Practice (right click and "Reselect all SmartList Selections" daily)  Diet/type: tubefeeds and NPO w/ meds via tube DVT prophylaxis: prophylactic heparin  GI prophylaxis: PPI Lines: Arterial Line Foley:  N/A Code Status:  full code  Friend Maryagnes Amos [phone] Son Khase Kappel [phone]  Labs   CBC: Recent Labs  Lab 05/01/2021 1039 04/25/2021 1040 04/28/2021 1229 04/19/2021 1516 05/01/21 0120 05/02/21 0408 05/03/21 0442  WBC 15.7*  --   --   --  17.3* 17.1* 16.3*  HGB 8.2*   < > 9.5* 9.5* 8.7* 7.2* 8.6*  HCT 25.0*   < > 28.0* 28.0* 25.0* 21.4* 26.3*  MCV 94.7  --   --   --  88.3 92.6 94.6  PLT 175  --   --   --  190 154 212   < > = values in this interval not displayed.     Basic Metabolic Panel: Recent Labs  Lab 04/10/2021 1039 04/28/2021 1040 05/07/2021 1229 04/09/2021 1516 05/01/21 0120 05/01/21 1356 05/01/21 1752 05/02/21 0408 05/03/21 0442  NA 139 137  138 140 137 135  --   --  134* 129*  K 3.9 3.7  3.8 3.6 4.5 3.4*  --   --  4.7 4.9  CL 104 105  --   --  98  --   --  98 92*  CO2 14*  --   --   --  23  --   --  22  22  GLUCOSE 207* 205*  --   --  96  --   --  134* 236*  BUN 98* 108*  --   --  38*  --   --  66* 48*  CREATININE 15.02* 16.60*  --   --  6.72*  --   --  11.44* 7.67*  CALCIUM 7.2*  --   --   --  8.6*  --   --  7.6* 8.1*  MG 2.3  --   --   --   --  2.0 2.0 2.1  2.1  PHOS 9.5*  --   --   --   --  6.5* 7.5* 7.8* 6.8*    CRITICAL CARE Performed by: Kipp Brood   Total critical care time: 40 minutes  Critical care time was exclusive of separately billable procedures and treating other patients.  Critical care was necessary to treat or prevent imminent or life-threatening deterioration.  Critical care was time spent personally by me on the following activities: development of treatment plan with patient and/or surrogate as well as nursing, discussions with consultants, evaluation of patient's response to treatment, examination of patient, obtaining history from patient or surrogate, ordering and performing treatments and interventions, ordering and review of laboratory studies, ordering and review of radiographic studies, pulse oximetry, re-evaluation of patient's condition and participation in multidisciplinary rounds.  Kipp Brood, MD [facility] ICU Physician [place]  Pager: [phone] Mobile: [phone] After hours: [phone].   05/05/2021 12:59 PM

## 2021-05-05 NOTE — Progress Notes (Signed)
[facility] Progress Note  Subjective: on vent. Hd yest w/ 3.5 L off.   Vitals:   05/05/21 0730 05/05/21 0745 05/05/21 0810 05/05/21 1100  BP:  (!) 126/47 (!) 116/38 (!) 102/31  Pulse: 73 67 67 64  Resp: (!) 22 18 (!) 28 (!) 29  Temp: (!) 97.2 F (36.2 C) (!) 97.2 F (36.2 C)    TempSrc:      SpO2: 97% 99% 98% 93%  Weight:      Height:        Exam: Gen on vent, sedated, not responsive Sclera anicteric, throat w/ ETT No jvd or bruits Chest clear anterior/ lateral RRR no MRG Abd obese, soft, no ascites  GU normal male MS no joint effusions or deformity Ext no LE or UE edema, no wounds or ulcers Neuro is on vent, sedated L FA AVF+bruit        Home meds include renvela 800 tid, prns    CXR IMPRESSION: Endotracheal tube in the proximal right mainstem bronchus. Withdraw at least 2 cm to be above the carina. Cardiomegaly and pulmonary edema.      OP HD: transient at [facility] MWF - home unit is [facility] in [place], [place]    [phone])   4h  106.5kg   Heparin 25u/ kg   LFA AVF  - rocaltrol 0.5 ug tiw     Assessment/ Plan: Cardiac arrest - downtime 15-20 minutes, on vent Acute resp failure - initial CXR showed severe pulm edema, imrpoved after HD.  Shock - on levo gtt at 6 ug min Volume overload - improved initially. Was getting a lot of IVF's but that is much better. Wt's are not accurate. HD yest 3.5 L off. Cont max UF on HD w/ levo support if available. Next HD Monday.  ESRD - HD since 2019. Is on HD in [place] MWF, visiting family here. Missed HD x 1 prior to admission. HD here yesterday, next HD Monday.  BP - not sure if on BP lowering meds at home. BP's stable here on levo gtt at 6 ug/min  Anemia ckd - Hb 9's here, follow MBD ckd - cont rocaltrol tiw, binder when eating      Rob Nicholas Ossa 05/05/2021, 11:37 AM   Recent Labs  Lab 05/02/21 0408 05/03/21 0442  K 4.7 4.9  BUN 66* 48*  CREATININE 11.44* 7.67*  CALCIUM 7.6* 8.1*  PHOS 7.8* 6.8*  HGB  7.2* 8.6*    Inpatient medications:  acetaminophen  650 mg Oral Q4H   Or   acetaminophen (TYLENOL) oral liquid 160 mg/5 mL  650 mg Per Tube Q4H   Or   acetaminophen  650 mg Rectal Q4H   baclofen  2.5 mg Per Tube TID   busPIRone  30 mg Per Tube Q8H   calcitRIOL  0.5 mcg Per Tube Daily   chlorhexidine gluconate (MEDLINE KIT)  15 mL Mouth Rinse BID   Chlorhexidine Gluconate Cloth  6 each Topical Q0600   docusate  100 mg Per Tube BID   EPINEPHrine       feeding supplement (PROSource TF)  45 mL Per Tube QID   fentaNYL (SUBLIMAZE) injection  50 mcg Intravenous Once   heparin  5,000 Units Subcutaneous Q8H   insulin aspart  4 Units Subcutaneous Q4H   mouth rinse  15 mL Mouth Rinse 10 times per day   pantoprazole (PROTONIX) IV  40 mg Intravenous QHS   polyethylene glycol  17 g Per Tube Daily  sodium chloride     feeding supplement (VITAL 1.5 CAL) 60 mL/hr at 05/04/21 1617   fentaNYL infusion INTRAVENOUS Stopped (05/04/21 1920)   levETIRAcetam Stopped (05/04/21 2133)   norepinephrine (LEVOPHED) Adult infusion 2 mcg/min (05/05/21 0850)   propofol (DIPRIVAN) infusion 15 mcg/kg/min (05/05/21 0834)   albuterol, fentaNYL (SUBLIMAZE) injection, heparin, heparin, heparin, labetalol, ondansetron (ZOFRAN) IV

## 2021-05-05 NOTE — Progress Notes (Signed)
vLTM EEG complete. No skin breakdown 

## 2021-05-05 NOTE — Procedures (Addendum)
Patient Name: Danny Patrick.  MRN: [id number]  Epilepsy Attending: Lora Havens  Referring Physician/Provider: Noe Gens, NP Duration: 05/04/2021 1723 to 05/05/2021 1015   Patient history: 60 year old male status post cardiac arrest noted to have seizure-like episodes.  EEG to evaluate for seizures.   Level of alertness: comatose   AEDs during EEG study: Propofol   Technical aspects: This EEG study was done with scalp electrodes positioned according to the 10-20 International system of electrode placement. Electrical activity was acquired at a sampling rate of '500Hz'$  and reviewed with a high frequency filter of '70Hz'$  and a low frequency filter of '1Hz'$ . EEG data were recorded continuously and digitally stored.    Description: EEG showed continuous generalized background suppression.  EEG was not reactive to tactile stimulation.     ABNORMALITY -Background suppression, generalized   IMPRESSION: This study is suggestive of profound diffuse encephalopathy, nonspecific etiology but given history of cardiac arrest is most likely due to anoxic/hypoxic brain injury.  No seizures or definite epileptiform discharges were seen during the study.   Danny Patrick

## 2021-05-06 DIAGNOSIS — R7881 Bacteremia: Secondary | ICD-10-CM | POA: Diagnosis not present

## 2021-05-06 DIAGNOSIS — J9601 Acute respiratory failure with hypoxia: Secondary | ICD-10-CM | POA: Diagnosis not present

## 2021-05-06 LAB — TROPONIN I (HIGH SENSITIVITY): Troponin I (High Sensitivity): 85 ng/L — ABNORMAL HIGH (ref <18)

## 2021-05-06 LAB — CBC WITH DIFFERENTIAL/PLATELET
Abs Immature Granulocytes: 2.5 10*3/uL — ABNORMAL HIGH (ref 0.00–0.07)
Basophils Absolute: 0.2 10*3/uL — ABNORMAL HIGH (ref 0.0–0.1)
Basophils Relative: 1 %
Eosinophils Absolute: 0.2 10*3/uL (ref 0.0–0.5)
Eosinophils Relative: 1 %
HCT: 21.7 % — ABNORMAL LOW (ref 39.0–52.0)
Hemoglobin: 7 g/dL — ABNORMAL LOW (ref 13.0–17.0)
Lymphocytes Relative: 13 %
Lymphs Abs: 2.9 10*3/uL (ref 0.7–4.0)
MCH: 29.9 pg (ref 26.0–34.0)
MCHC: 32.3 g/dL (ref 30.0–36.0)
MCV: 92.7 fL (ref 80.0–100.0)
Metamyelocytes Relative: 3 %
Monocytes Absolute: 1.6 10*3/uL — ABNORMAL HIGH (ref 0.1–1.0)
Monocytes Relative: 7 %
Myelocytes: 3 %
Neutro Abs: 14.9 10*3/uL — ABNORMAL HIGH (ref 1.7–7.7)
Neutrophils Relative %: 67 %
Platelets: 214 10*3/uL (ref 150–400)
Promyelocytes Relative: 5 %
RBC: 2.34 MIL/uL — ABNORMAL LOW (ref 4.22–5.81)
RDW: 14.7 % (ref 11.5–15.5)
WBC: 22.3 10*3/uL — ABNORMAL HIGH (ref 4.0–10.5)
nRBC: 2.1 % — ABNORMAL HIGH (ref 0.0–0.2)
nRBC: 4 /100 WBC — ABNORMAL HIGH

## 2021-05-06 LAB — POCT I-STAT 7, (LYTES, BLD GAS, ICA,H+H)
Acid-base deficit: 7 mmol/L — ABNORMAL HIGH (ref 0.0–2.0)
Acid-base deficit: 4 mmol/L — ABNORMAL HIGH (ref 0.0–2.0)
Bicarbonate: 21 mmol/L (ref 20.0–28.0)
Bicarbonate: 22.8 mmol/L (ref 20.0–28.0)
Calcium, Ion: 0.92 mmol/L — ABNORMAL LOW (ref 1.15–1.40)
Calcium, Ion: 0.9 mmol/L — ABNORMAL LOW (ref 1.15–1.40)
HCT: 23 % — ABNORMAL LOW (ref 39.0–52.0)
HCT: 22 % — ABNORMAL LOW (ref 39.0–52.0)
Hemoglobin: 7.8 g/dL — ABNORMAL LOW (ref 13.0–17.0)
Hemoglobin: 7.5 g/dL — ABNORMAL LOW (ref 13.0–17.0)
O2 Saturation: 87 %
O2 Saturation: 100 %
Patient temperature: 36.3
Patient temperature: 36.8
Potassium: 6.5 mmol/L (ref 3.5–5.1)
Potassium: 6.1 mmol/L — ABNORMAL HIGH (ref 3.5–5.1)
Sodium: 128 mmol/L — ABNORMAL LOW (ref 135–145)
Sodium: 129 mmol/L — ABNORMAL LOW (ref 135–145)
TCO2: 23 mmol/L (ref 22–32)
TCO2: 24 mmol/L (ref 22–32)
pCO2 arterial: 57 mmHg — ABNORMAL HIGH (ref 32.0–48.0)
pCO2 arterial: 49.2 mmHg — ABNORMAL HIGH (ref 32.0–48.0)
pH, Arterial: 7.169 — CL (ref 7.350–7.450)
pH, Arterial: 7.273 — ABNORMAL LOW (ref 7.350–7.450)
pO2, Arterial: 65 mmHg — ABNORMAL LOW (ref 83.0–108.0)
pO2, Arterial: 313 mmHg — ABNORMAL HIGH (ref 83.0–108.0)

## 2021-05-06 LAB — COMPREHENSIVE METABOLIC PANEL
ALT: 192 U/L — ABNORMAL HIGH (ref 0–44)
AST: 590 U/L — ABNORMAL HIGH (ref 15–41)
Albumin: 2.3 g/dL — ABNORMAL LOW (ref 3.5–5.0)
Alkaline Phosphatase: 282 U/L — ABNORMAL HIGH (ref 38–126)
Anion gap: 21 — ABNORMAL HIGH (ref 5–15)
BUN: 72 mg/dL — ABNORMAL HIGH (ref 6–20)
CO2: 19 mmol/L — ABNORMAL LOW (ref 22–32)
Calcium: 9.3 mg/dL (ref 8.9–10.3)
Chloride: 90 mmol/L — ABNORMAL LOW (ref 98–111)
Creatinine, Ser: 7.69 mg/dL — ABNORMAL HIGH (ref 0.61–1.24)
GFR, Estimated: 8 mL/min — ABNORMAL LOW (ref >60)
Glucose, Bld: 253 mg/dL — ABNORMAL HIGH (ref 70–99)
Potassium: 6.9 mmol/L (ref 3.5–5.1)
Sodium: 130 mmol/L — ABNORMAL LOW (ref 135–145)
Total Bilirubin: 3.5 mg/dL — ABNORMAL HIGH (ref 0.3–1.2)
Total Protein: 7.1 g/dL (ref 6.5–8.1)

## 2021-05-06 LAB — GLUCOSE, CAPILLARY
Glucose-Capillary: 222 mg/dL — ABNORMAL HIGH (ref 70–99)
Glucose-Capillary: 225 mg/dL — ABNORMAL HIGH (ref 70–99)
Glucose-Capillary: 199 mg/dL — ABNORMAL HIGH (ref 70–99)

## 2021-05-06 LAB — PREPARE RBC (CROSSMATCH)

## 2021-05-06 LAB — PHOSPHORUS: Phosphorus: 7.9 mg/dL — ABNORMAL HIGH (ref 2.5–4.6)

## 2021-05-06 LAB — POTASSIUM: Potassium: 5.8 mmol/L — ABNORMAL HIGH (ref 3.5–5.1)

## 2021-05-06 MED ORDER — SODIUM BICARBONATE 8.4 % IV SOLN
50.0000 meq | Freq: Once | INTRAVENOUS | Status: AC
  Administered 2021-05-06: 50 meq via INTRAVENOUS
  Filled 2021-05-06: qty 50

## 2021-05-06 MED ORDER — MIDAZOLAM HCL 2 MG/2ML IJ SOLN: 2.0000 mg | INTRAMUSCULAR | Status: DC | PRN

## 2021-05-06 MED ORDER — SODIUM POLYSTYRENE SULFONATE 15 GM/60ML PO SUSP
15.0000 g | Freq: Once | ORAL | Status: AC
  Administered 2021-05-06: 15 g
  Filled 2021-05-06: qty 60

## 2021-05-06 MED ORDER — SODIUM ZIRCONIUM CYCLOSILICATE 10 G PO PACK
10.0000 g | PACK | Freq: Three times a day (TID) | ORAL | Status: DC
  Administered 2021-05-06: 10 g
  Filled 2021-05-06 (×3): qty 1

## 2021-05-06 MED ORDER — SODIUM CHLORIDE 0.9% IV SOLUTION: Freq: Once | INTRAVENOUS | Status: AC

## 2021-05-06 MED ORDER — CALCIUM GLUCONATE-NACL 1-0.675 GM/50ML-% IV SOLN
1.0000 g | Freq: Once | INTRAVENOUS | Status: AC
  Administered 2021-05-06: 1000 mg via INTRAVENOUS
  Filled 2021-05-06: qty 50

## 2021-05-07 ENCOUNTER — Encounter (HOSPITAL_COMMUNITY): Payer: Self-pay | Admitting: Emergency Medicine

## 2021-05-07 LAB — TYPE AND SCREEN
ABO/RH(D): O POS
Antibody Screen: NEGATIVE
Unit division: 0

## 2021-05-07 LAB — BPAM RBC
Blood Product Expiration Date: 202209282359
ISSUE DATE / TIME: 202208280546
Unit Type and Rh: 5100

## 2021-05-07 LAB — CULTURE, BLOOD (ROUTINE X 2)
Culture: NO GROWTH
Culture: NO GROWTH

## 2021-05-10 NOTE — Progress Notes (Addendum)
Asystole noted on bedside ekg. Occasional electrical beats noted.  PEA.  No heartbeat auscultated x2 min by this RN and Jerre Simon.  Dr Kipp Brood made aware.    1656--[facility] notified of cardiac time of death.  Spoke with Marybelle Killings.

## 2021-05-10 NOTE — Progress Notes (Signed)
[facility] notified of GCS.  Spoke with Guerry Bruin.  Referral number: [id number]

## 2021-05-10 NOTE — Progress Notes (Signed)
[facility] Progress Note Patient Name: Danny Patrick. DOB: 1961/04/11 MRN: [id number]   Date of Service  May 17, 2021  HPI/Events of Note  Asking for flexi seal   eICU Interventions  ordered     Intervention Category Minor Interventions: Other:  Elmer Sow 17-May-2021, 12:07 AM

## 2021-05-10 NOTE — Progress Notes (Signed)
[facility] Progress Note  Subjective: on vent. K+ up to 6.9 overnight, this am down to 5.8 after IV bicarb, Ca, NG kayexalate and lokelma x 1 each.    Vitals:   2021-05-26 1000 05/26/2021 1100 05-26-21 1140 05-26-21 1200  BP:   (!) 142/46 (!) 144/46  Pulse: (!) 49 (!) 55 (!) 56 (!) 56  Resp: 14 15 (!) 26 16  Temp: 98.4 F (36.9 C) (!) 97 F (36.1 C)  (!) 97.3 F (36.3 C)  TempSrc:      SpO2: 97% 98% 98% 99%  Weight:      Height:        Exam: Gen on vent, sedated, not responsive Sclera anicteric, throat w/ ETT No jvd or bruits Chest clear anterior/ lateral RRR no MRG Abd obese, soft, no ascites  GU normal male MS no joint effusions or deformity Ext 1+ bilat UE edema, no wounds or ulcers Neuro is on vent, sedated L FA AVF+bruit        Home meds include renvela 800 tid, prns    CXR 8/27 -  IMPRESSION: Appliances appear in satisfactory position. Cardiac enlargement. Patchy infiltrates in the left upper lung and right base.      OP HD: transient at [facility] MWF - home unit is [facility] in [place], [place]    [phone])   4h  106.5kg   Heparin 25u/ kg   LFA AVF  - rocaltrol 0.5 ug tiw     Assessment/ Plan: Cardiac arrest - downtime 15-20 minutes, on vent Acute resp failure - initial CXR showed severe pulm edema, improved after HD. Now just LUL infiltrates.  Shock - on levo gtt at 6 ug min Hyperkalemia - K+ down 5.8 this am, ordered lokelma 10 tid. HD tomorrow.  Volume overload - improved initially and was down to dry wt, now back up 7kg over at 114kg (got bed wt w/ cooling suit all removed). Tolerating 3-4 L UF on HD. UF same w/ HD Monday.  ESRD - HD since 2019. Is on HD in [place] MWF, visiting family here. Missed HD x 1 prior to admission. HD next on Monday.  BP - not sure if on BP lowering meds at home. BP's stable here on levo gtt at 6 ug/min  Anemia ckd - Hb 9's here, follow MBD ckd - cont rocaltrol tiw, binder when eating      Rob Jesenia Spera 05-26-21,  1:54 PM   Recent Labs  Lab 05/03/21 0442 26-May-2021 0349 05/26/2021 0354 05-26-21 0457 05-26-21 0800  K 4.9   < > 6.9* 6.1* 5.8*  BUN 48*  --  72*  --   --   CREATININE 7.67*  --  7.69*  --   --   CALCIUM 8.1*  --  9.3  --   --   PHOS 6.8*  --  7.9*  --   --   HGB 8.6*   < > 7.0* 7.5*  --    < > = values in this interval not displayed.    Inpatient medications:  acetaminophen  650 mg Oral Q4H   Or   acetaminophen (TYLENOL) oral liquid 160 mg/5 mL  650 mg Per Tube Q4H   Or   acetaminophen  650 mg Rectal Q4H   baclofen  2.5 mg Per Tube TID   busPIRone  30 mg Per Tube Q8H   calcitRIOL  0.5 mcg Per Tube Daily   chlorhexidine gluconate (MEDLINE KIT)  15 mL Mouth Rinse BID  Chlorhexidine Gluconate Cloth  6 each Topical Q0600   docusate  100 mg Per Tube BID   feeding supplement (PROSource TF)  45 mL Per Tube QID   fentaNYL (SUBLIMAZE) injection  50 mcg Intravenous Once   heparin  5,000 Units Subcutaneous Q8H   insulin aspart  4 Units Subcutaneous Q4H   mouth rinse  15 mL Mouth Rinse 10 times per day   pantoprazole (PROTONIX) IV  40 mg Intravenous QHS   polyethylene glycol  17 g Per Tube Daily   sodium zirconium cyclosilicate  10 g Per Tube TID    sodium chloride     feeding supplement (VITAL 1.5 CAL) 1,000 mL (May 28, 2021 0933)   fentaNYL infusion INTRAVENOUS Stopped (05/04/21 1920)   levETIRAcetam Stopped (05/05/21 2145)   norepinephrine (LEVOPHED) Adult infusion 13 mcg/min (05-28-2021 1213)   propofol (DIPRIVAN) infusion 15 mcg/kg/min (2021-05-28 0200)   albuterol, fentaNYL (SUBLIMAZE) injection, heparin, heparin, heparin, labetalol, ondansetron (ZOFRAN) IV

## 2021-05-10 NOTE — Progress Notes (Addendum)
[facility] Progress Note Patient Name: Danny Patrick. DOB: 09/09/1961 MRN: [id number]   Date of Service  June 04, 2021  HPI/Events of Note  Camera eval: In sinus Bradycardia at 45. MAP > 70.  Propofol held. No pupil or gag reflex. In synchrony with vent 450/5/15/100%.  Was tachypnea earlier.  ABG stat 7.16/57/63/21.   eICU Interventions  - increased rate to 24, follow ABG in an hour - Triglycerides, CMP, Po4, ion calcium, cbc, troponin  stat. - continue other care - encephalopathy, possible ABI on EEG.      Intervention Category Intermediate Interventions: Arrhythmia - evaluation and management  Elmer Sow 06-04-2021, 3:55 AM  4:36 CBC came back with Hgb 7.0   ESRD    the K+ of 6.5 came back on those ABG's, I have asked RN to call as soon the CMP comes back    HR 61   161/47 with Levo at 15. - follow CMP-K level.  Hyperkalemia can cause brady.ESRD.  D50 + Insuline/albuteriol nebs /bicarb/kayexalate via NG tube/ca gluconate for hypocalcemia stat ordered  to shift K into cells.  1 PRBC transfusion. Follow Hg post  Discused with bed side RN.   5:03 ABG improving resp and metabolic acidosis, pH 99991111. Wean down fio2 as tolerated to keep sats > 90%.

## 2021-05-10 NOTE — Progress Notes (Signed)
NAME:  Danny Patrick., MRN:  [id number], DOB:  [birth year], LOS: 6 ADMISSION DATE:  04/24/2021, CONSULTATION DATE:  05/05/2021 REFERRING MD:  Dr. Tyrone Nine, CHIEF COMPLAINT:  Cardiac Arrest    History of Present Illness:  61 y/o M who presented to [facility] ER on 8/22 via EMS post cardiac arrest.   The patient recently moved to [facility] [place] in the last two months.  He returned back to [facility] to go to kidney transplant classes on Thursday 04/26/21.  He reported he had been more short of breath recently with activity.  Family noted he was having to pick his legs up to get them into the car.  He missed HD on Friday 8/19 after traveling.  Family reports they were at home am of presentation.  The son left to go to the restroom for a couple of minutes and when he returned he noted the patient to have snoring respirations (pt snores when he sleeps per son and recognized the abnormal sounds). Family called 911 & initiated CPR.  Initial rhythm for EMS was PEA with a rate of 30.  He was given EPI x3.  His HR became elevated after EPI and patient was cardioverted per EMS.  He was bagged into the ER and intubated on arrival.  Received 30 mg etomidate, 100 mg rocuronium for intubation. Initial VBG 7.316, CO2 30.  ISTAT labs - Na 137, K 3.7, glucose 205, BUN 108, CR 16.6, ionized calcium 0.84, WBC 15.7, Hgb 8.2.  CXR showed a right mainstem intubation.  He had spontaneous respirations and was started on propofol.  Unfortunately, he suffered an additional arrest in the ER requiring ACLS x1 round.  He was treated with LR x1 L, & pan cultured.  UDS negative. UA with elevated glucose, negative ketones, protein >300, rare bacteria.  Post arrest ABG 7.134 / 54.3 / 128 / 18.2.  CXR showed right mainstem intubation (adjusted in ER), cardiomegaly and pulmonary edema.    Friend reports he does not normally have regular bowel movements.  Noted he had difficulty buttoning his pants on 8/21. He had COVID in 2020 and has been vaccinated.   PCCM called  for ICU admission.   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  Ventilator dyssynchrony requiring change to SIMV. 8/26 all sedation fully stopped.  8/27 recurrent myoclonic seizures.  MRI performed shows extensive restricted diffusion consistent with global ischemic injury.  Interim History / Subjective:   Labile blood pressure, on and off NE. Patient has started to have episodes of jerking movements.   Objective   Blood pressure (!) 133/45, pulse 61, temperature 97.9 F (36.6 C), resp. rate 15, height '5\' 9"'$  (1.753 m), weight 117.5 kg, SpO2 100 %.    Vent Mode: PRVC;SIMV;PSV FiO2 (%):  [40 %-100 %] 60 % Set Rate:  [15 bmp-24 bmp] 24 bmp Vt Set:  [470 mL] 470 mL PEEP:  [5 cmH20] 5 cmH20 Pressure Support:  [10 cmH20] 10 cmH20   Intake/Output Summary (Last 24 hours) at [id number] Last data filed at 808/31/220750 Gross per 24 hour  Intake 824.58 ml  Output --  Net 824.58  ml    Filed Weights   05/04/21 1130 05/04/21 1545 05/05/21 0500  Weight: 121.7 kg 117.1 kg 117.5 kg    General:  elderly appearing male in NAD on vent Neuro: Off sedation, R pupil irregular. L pupil reactive. No response to pain. Rhythymic jerking of the right arm, occasional oral twitching, more with stimulation.  HEENT:  /AT, No JVD noted Cardiovascular:  RRR, no MRG Lungs: Chest clear.  Occasional ventilator dyssynchrony due to hiccups. Abdomen:  Soft, non-distended, non-tender. 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 - Will discuss MRI in context of current clinical picture. Outlook for meaningful recovery is dismal as best recovery is likely minimally responsive state long-term.  Patient is on hemodialysis which further complicate placement and he would likely end up far from family.   Best Practice (right click and "Reselect all SmartList Selections" daily)  Diet/type: tubefeeds and NPO w/ meds via tube DVT prophylaxis: prophylactic heparin  GI prophylaxis: PPI Lines: Arterial Line Foley:  N/A Code Status:  full code  Friend Danny Patrick [phone] Son Danny Patrick [phone]  Labs   CBC: Recent Labs  Lab 04/28/2021 1039 04/29/2021 1040 05/01/21 0120 05/02/21 0408 05/03/21 0442 05-24-21 0349 05/24/21 0354 2021-05-24 0457  WBC 15.7*  --  17.3* 17.1* 16.3*  --  22.3*  --   NEUTROABS  --   --   --   --   --   --  14.9*  --   HGB 8.2*   < > 8.7* 7.2* 8.6* 7.8* 7.0* 7.5*  HCT 25.0*   < > 25.0* 21.4* 26.3* 23.0* 21.7* 22.0*  MCV 94.7  --  88.3 92.6 94.6  --  92.7  --   PLT 175  --  190 154 212  --  214  --    < > = values in this interval not displayed.     Basic Metabolic Panel: Recent Labs  Lab 04/24/2021 1039 04/13/2021 1040 04/11/2021 1229 05/01/21 0120 05/01/21 1356 05/01/21 1752 05/02/21 0408 05/03/21 0442 May 24, 2021 0349 May 24, 2021 0354 2021/05/24 0457 24-May-2021 0800  NA 139 137  138   < > 135  --   --  134* 129* 128* 130* 129*  --    K 3.9 3.7  3.8   < > 3.4*  --   --  4.7 4.9 6.5* 6.9* 6.1* 5.8*  CL 104 105  --  98  --   --  98 92*  --  90*  --   --   CO2 14*  --   --  23  --   --  22 22  --  19*  --   --   GLUCOSE 207* 205*  --  96  --   --  134* 236*  --  253*  --   --   BUN 98* 108*  --  38*  --   --  66*  48*  --  72*  --   --   CREATININE 15.02* 16.60*  --  6.72*  --   --  11.44* 7.67*  --  7.69*  --   --   CALCIUM 7.2*  --   --  8.6*  --   --  7.6* 8.1*  --  9.3  --   --   MG 2.3  --   --   --  2.0 2.0 2.1 2.1  --   --   --   --   PHOS 9.5*  --   --   --  6.5* 7.5* 7.8* 6.8*  --  7.9*  --   --    < > = values in this interval not displayed.    CRITICAL CARE Performed by: Kipp Brood   Total critical care time: 40 minutes  Critical care time was exclusive of separately billable procedures and treating other patients.  Critical care was necessary to treat or prevent imminent or life-threatening deterioration.  Critical care was time spent personally by me on the following activities: development of treatment plan with patient and/or surrogate as well as nursing, discussions with consultants, evaluation of patient's response to treatment, examination of patient, obtaining history from patient or surrogate, ordering and performing treatments and interventions, ordering and review of laboratory studies, ordering and review of radiographic studies, pulse oximetry, re-evaluation of patient's condition and participation in multidisciplinary rounds.  Kipp Brood, MD [facility] ICU Physician [place]  Pager: [phone] Mobile: [phone] After hours: [phone].   05-22-2021 8:59 AM

## 2021-05-10 DEATH — deceased

## 2021-06-09 NOTE — Discharge Summary (Signed)
DEATH SUMMARY   Patient Details  Name: Danny Patrick. MRN: [id number] DOB: 07/30/1961  Admission/Discharge Information   Admit Date:  2021-05-23  Date of Death: Date of Death: 05/29/21  Time of Death: Time of Death: 1630-12-19  Length of Stay: 12/06/2022  Referring Physician: [name], NP   Reason(s) for Hospitalization  Cardiac arrest  Diagnoses  Preliminary cause of death:  Secondary Diagnoses (including complications and co-morbidities):  Active Problems:   Cardiac arrest [facility])   Acute respiratory failure with hypoxia (HCC)   Seizure ([place])   ESRD on hemodialysis [facility])   Brief Hospital Course (including significant findings, care, treatment, and services provided and events leading to death)  Danny Phipps. is a 60 y.o. year old male who had been on hemodialysis for several years and was being evaluated for renal transplantation.  He traveled to [place] for a family death and missed a session of dialysis.  He returned home and was found by family to be unresponsive.  By the time EMS arrived he had suffered a PEA cardiac arrest felt to be due to hyperkalemia and volume overload from his missed dialysis.  Once in the ICU hemodynamics initially stabilized and he was weaned off vasopressors he tolerated hemodialysis.  Unfortunately he developed myoclonus requiring initiation of propofol infusion and anticonvulsant medication to control these.  He was placed on continuous EEG monitor and burst suppression was eventually achieved.  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  MRI showed diffuse anoxic injury which coupled with his poor neurological recovery and recurrent myoclonus portended a dismal prognosis.  A family meeting was convened and the decision was made to transition the patient to comfort care and not to attempt CPR..  In the meantime, the patient began to show signs of increased hemodynamic  instability and eventually became asystolic and he was allowed to pass peacefully.    Pertinent Labs and Studies  Significant Diagnostic Studies CT HEAD WO CONTRAST (5MM)  Result Date: 23-May-2021 CLINICAL DATA:  Mental status change EXAM: CT HEAD WITHOUT CONTRAST TECHNIQUE: Contiguous axial images were obtained from the base of the skull through the vertex without intravenous contrast. COMPARISON:  01/17/2021 FINDINGS: Brain: No evidence of acute infarction, hemorrhage, hydrocephalus, extra-axial collection or mass lesion/mass effect. Vascular: No hyperdense vessels. Scattered calcifications in the intracranial carotids. Skull: Normal. Negative for fracture or focal lesion. Sinuses/Orbits: Shrunken right globe, with calcium deposits and increased density in the sclera and retina, which is new compared to the prior exam. Normal left globe. Partial opacification of the ethmoid air cells and hypoplastic left greater than right sphenoid sinus, with mild mucosal thickening in the maxillary sinuses. Air-fluid level in the nasal cavity. Other: Trace fluid in bilateral mastoid air cells. Redemonstrated subcutaneous nodules in the scalp, most likely sebaceous cyst. IMPRESSION: 1. No acute intracranial process. 2. 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 Correlate with physical exam. 3. Air-fluid level in the nasal cavity and partial opacification of the paranasal sinuses, which is not uncommon in the setting of intubation. Electronically Signed   By: Merilyn Baba M.D.   On: May 23, 2021 18:42   MR BRAIN WO CONTRAST  Result Date: 05/05/2021 CLINICAL DATA:  Anoxic brain damage.  Cardiac arrest. EXAM: MRI HEAD WITHOUT CONTRAST TECHNIQUE: Multiplanar, multiecho pulse sequences of the brain and surrounding structures were obtained without intravenous contrast.  COMPARISON:  Head CT 04/13/2021 and MRI 01/17/2021 FINDINGS: Brain: There is  widespread restricted diffusion throughout both cerebral hemispheres preferentially involving cortex with associated mild edema. The basal ganglia and cerebellum are also involved. There is no midline shift or other evidence of herniation. There is subtle effacement of the ventricles. 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 There are also a few chronic microhemorrhages elsewhere in both cerebral hemispheres. No mass or extra-axial fluid collection is evident. Vascular: Major intracranial vascular flow voids are preserved. Skull and upper cervical spine: No suspicious marrow lesion. Sinuses/Orbits: Right phthisis bulbi. Chronically abnormal appearance of the left globe with subretinal fluid. Extensive bilateral ethmoid and sphenoid sinus opacification. Mild mucosal thickening in the maxillary sinuses. Bilateral mastoid and middle ear effusions. Other: Numerous chronic scalp nodules. IMPRESSION: Findings consistent with global hypoxic-ischemic injury. Electronically Signed   By: Logan Bores M.D.   On: 05/05/2021 17:48   DG Chest Port 1 View  Result Date: 05/02/2021 CLINICAL DATA:  Central line placement EXAM: PORTABLE CHEST 1 VIEW COMPARISON:  05/01/2021 FINDINGS: Endotracheal tube with tip measuring 2.9 cm above the carina. Enteric tube is present. Tip is off the field of view but below the left hemidiaphragm. Right central venous catheter with tip over the cavoatrial junction region. No pneumothorax. Shallow inspiration. Cardiac enlargement. Patchy infiltrates in the left upper lung and right base. No pleural effusions. IMPRESSION: Appliances appear in satisfactory position. Cardiac enlargement. Patchy infiltrates in the left upper lung and right base. Electronically Signed   By: Lucienne Capers M.D.   On: 05/02/2021 01:46   DG CHEST PORT 1 VIEW  Result Date: 05/01/2021 CLINICAL DATA:  Endotracheal tube present, acute respiratory failure, cardiac arrest EXAM: PORTABLE CHEST 1 VIEW  COMPARISON:  04/24/2021 FINDINGS: Endotracheal and enteric tubes are again identified. Endotracheal tube has been retracted. Lung volumes are again low. Improved lung aeration. There is left basilar atelectasis. No significant pleural effusion. No pneumothorax. Similar cardiomediastinal contours. IMPRESSION: Endotracheal tube has been retracted. Lung aeration is improved. Left basilar atelectasis. Electronically Signed   By: Macy Mis M.D.   On: 05/01/2021 08:37   DG Chest Portable 1 View  Result Date: 04/20/2021 CLINICAL DATA:  CPR.  Endotracheal placement. EXAM: PORTABLE CHEST 1 VIEW COMPARISON:  None. FINDINGS: Endotracheal tube is in the proximal right mainstem bronchus. Withdraw 2 cm to be above the carina. Orogastric or nasogastric tube enters the abdomen. Enlarged cardiac silhouette. Pulmonary edema. No collapse or effusion. No acute bone finding. IMPRESSION: Endotracheal tube in the proximal right mainstem bronchus. Withdraw at least 2 cm to be above the carina. Cardiomegaly and pulmonary edema. Electronically Signed   By: Nelson Chimes M.D.   On: 04/25/2021 11:17   DG Abd Portable 1V  Result Date: 05/04/2021 CLINICAL DATA:  Abdominal distension, history of cardiac arrest EXAM: PORTABLE ABDOMEN - 1 VIEW COMPARISON:  None. FINDINGS: Supine frontal view of the abdomen and pelvis excludes the bilateral flanks, hemidiaphragms, and pubic symphysis by collimation. Enteric catheter tip and side port project over the gastric antrum. Defibrillator pad overlies left upper quadrant. The bowel gas pattern is unremarkable without obstruction or ileus. No masses or abnormal calcifications. IMPRESSION: 1. Unremarkable bowel gas pattern. Electronically Signed   By: Randa Ngo M.D.   On: 04/11/2021 15:21   EEG adult  Result Date: 05/01/2021 Lora Havens, MD     05/01/2021  8:55 AM Patient Name: Danny Patrick. MRN: [id number] Epilepsy Attending: Lora Havens Referring  Physician/Provider: Noe Gens, NP Date: 04/29/2021 Duration: 22.22 mins Patient history: 60 year old male status post cardiac arrest noted to have seizure-like episodes.  EEG to evaluate for seizures. Level of alertness: comatose AEDs during EEG study: Propofol Technical aspects: This EEG study was done with scalp electrodes positioned according to the 10-20 International system of electrode placement. Electrical activity was acquired at a sampling rate of '500Hz'$  and reviewed with a high frequency filter of '70Hz'$  and a low frequency filter of '1Hz'$ . EEG data were recorded continuously and digitally stored. Description: EEG showed continuous generalized low amplitude 2 to 3 Hz delta slowing. Patient was noted to have intermittent episodes of brief whole body jerks. Concomitant EEG showed generalized polyspikes consistent with myoclonic seizure.  Hyperventilation and photic stimulation were not performed.   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  With history of cardiac arrest, this is likely secondary to anoxic/hypoxic brain injury. Priyanka Barbra Sarks   Overnight EEG with video  Result Date: 05/01/2021 Lora Havens, MD     05/02/2021  9:05 AM Patient Name: Danny Patrick. MRN: [id number] Epilepsy Attending: Lora Havens Referring Physician/Provider: Noe Gens, NP Duration: 05/01/2021 1723 to 05/01/2021 1723  Patient history: 60 year old male status post cardiac arrest noted to have seizure-like episodes.  EEG to evaluate for seizures.  Level of alertness: comatose  AEDs during EEG study: Propofol  Technical aspects: This EEG study was done with scalp electrodes positioned according to the 10-20 International system of electrode placement. Electrical activity was acquired at a sampling rate of '500Hz'$  and reviewed with a high frequency filter of '70Hz'$  and a low frequency filter  of '1Hz'$ . EEG data were recorded continuously and digitally stored.  Description: EEG initially showed continuous generalized low amplitude 2 to 3 Hz delta slowing. Patient was noted to have intermittent episodes of brief whole body jerks, once every few minutes. Concomitant EEG showed generalized polyspikes consistent with myoclonic seizure. After around 1900 on 04/28/2021 as sedation was adjusted, myoclonic seizures improved. EEG then showed burst suppression pattern with burst of high amplitude sharply contoured 3-'5Hz'$  theta-delta slowing lasting 1-2 seconds and eeg suppression lasting 2-5 seconds. Hyperventilation and photic stimulation were not performed.    ABNORMALITY - Myoclonic seizure, generalized - Continuous slow, generalized - Burst suppression, generalized  IMPRESSION: This study initially showed myoclonic seizures with generalized onset, once every few minutes as well as severe encephalopathy.  After around 1900 on 05/03/2021 as sedation was adjusted, myoclonic seizures improved and eeg was suggestive of profound diffuse encephalopathy. With history of cardiac arrest, this is likely secondary to anoxic/hypoxic brain injury.  Lora Havens   ECHOCARDIOGRAM COMPLETE  Result Date: 04/28/2021    ECHOCARDIOGRAM REPORT   Patient Name:   Danny Patrick. Date of Exam: 04/20/2021 Medical Rec #:  [id number]      Height:       69.0 in Accession #:    [id number]     Weight:       264.6 lb Date of Birth:  1961/02/07      BSA:          2.327 m Patient Age:    57 years       BP:           165/76 mmHg Patient Gender: M              HR:  79 bpm. Exam Location:  Inpatient Procedure: 2D Echo, Cardiac Doppler and Color Doppler Indications:    Cardiac Arrest  History:        Patient has no prior history of Echocardiogram examinations.                 Respiratory Failure.  Sonographer:    Merrie Roof RDCS Referring Phys: [id number] [place]  1. Left ventricular ejection fraction, by estimation, is 55  to 60%. The left ventricle has normal function. The left ventricle has no regional wall motion abnormalities. There is severe left ventricular hypertrophy. Left ventricular diastolic parameters  are indeterminate.  2. Right ventricular systolic function is mildly reduced. The right ventricular size is normal. There is severely elevated pulmonary artery systolic pressure.  3. Left atrial size was moderately dilated.  4. Right atrial size was severely dilated.  5. The mitral valve is normal in structure. No evidence of mitral valve regurgitation. No evidence of mitral stenosis.  6. Tricuspid valve regurgitation is mild to moderate.  7. The aortic valve was not well visualized. Aortic valve regurgitation is not visualized. No aortic stenosis is present. FINDINGS  Left Ventricle: Left ventricular ejection fraction, by estimation, is 55 to 60%. The left ventricle has normal function. The left ventricle has no regional wall motion abnormalities. The left ventricular internal cavity size was normal in size. There is  severe left ventricular hypertrophy. Left ventricular diastolic parameters are indeterminate. Right Ventricle: The right ventricular size is normal. Right vetricular wall thickness was not well visualized. Right ventricular systolic function is mildly reduced. There is severely elevated pulmonary artery systolic pressure. The tricuspid regurgitant velocity is 3.60 m/s, and with an assumed right atrial pressure of 10 mmHg, the estimated right ventricular systolic pressure is [id number] mmHg. Left Atrium: Left atrial size was moderately dilated. Right Atrium: Right atrial size was severely dilated. Pericardium: Trivial pericardial effusion is present. Mitral Valve: The mitral valve is normal in structure. No evidence of mitral valve regurgitation. No evidence of mitral valve stenosis. Tricuspid Valve: The tricuspid valve is normal in structure. Tricuspid valve regurgitation is mild to moderate. Aortic Valve: The  aortic valve was not well visualized. Aortic valve regurgitation is not visualized. No aortic stenosis is present. Aortic valve mean gradient measures 4.0 mmHg. Aortic valve peak gradient measures 7.0 mmHg. Aortic valve area, by VTI measures 3.07 cm. Pulmonic Valve: The pulmonic valve was not well visualized. Pulmonic valve regurgitation is trivial. Aorta: The aortic root and ascending aorta are structurally normal, with no evidence of dilitation. IAS/Shunts: The interatrial septum was not well visualized.  LEFT VENTRICLE PLAX 2D LVIDd:         4.70 cm  Diastology LVIDs:         3.00 cm  LV e' medial:    7.27 cm/s LV PW:         1.50 cm  LV E/e' medial:  7.5 LV IVS:        1.40 cm  LV e' lateral:   7.09 cm/s LVOT diam:     2.20 cm  LV E/e' lateral: 7.7 LV SV:         67 LV SV Index:   29 LVOT Area:     3.80 cm  RIGHT VENTRICLE             IVC RV Basal diam:  3.60 cm     IVC diam: 2.40 cm RV S prime:     11.60 cm/s TAPSE (  M-mode): 1.2 cm LEFT ATRIUM              Index       RIGHT ATRIUM           Index LA diam:        4.00 cm  1.72 cm/m  RA Area:     30.40 cm LA Vol (A2C):   106.0 ml 45.56 ml/m RA Volume:   133.00 ml 57.16 ml/m LA Vol (A4C):   100.0 ml 42.98 ml/m LA Biplane Vol: 104.0 ml 44.70 ml/m  AORTIC VALVE AV Area (Vmax):    2.81 cm AV Area (Vmean):   2.55 cm AV Area (VTI):     3.07 cm AV Vmax:           132.00 cm/s AV Vmean:          86.900 cm/s AV VTI:            0.219 m AV Peak Grad:      7.0 mmHg AV Mean Grad:      4.0 mmHg LVOT Vmax:         97.50 cm/s LVOT Vmean:        58.250 cm/s LVOT VTI:          0.177 m LVOT/AV VTI ratio: 0.81  AORTA Ao Root diam: 3.30 cm Ao Asc diam:  3.20 cm MITRAL VALVE               TRICUSPID VALVE MV Area (PHT): 3.53 cm    TR Peak grad:   51.8 mmHg MV Decel Time: 215 msec    TR Vmax:        360.00 cm/s MV E velocity: 54.40 cm/s MV A velocity: 62.60 cm/s  SHUNTS MV E/A ratio:  0.87        Systemic VTI:  0.18 m                            Systemic Diam: 2.20 cm  Oswaldo Milian MD Electronically signed by Oswaldo Milian MD Signature Date/Time: 04/11/2021/6:10:01 PM    Final    VAS [place] LOWER EXTREMITY VENOUS (DVT)  Result Date: 05/03/2021  Lower Venous DVT Study Patient Name:  Danny Patrick.  Date of Exam:   05/03/2021 Medical Rec #: [id number]       Accession #:    [id number] Date of Birth: 01-10-1961       Patient Gender: M Patient Age:   30 years Exam Location:  [facility] Procedure:      VAS [place] LOWER EXTREMITY VENOUS (DVT) Referring Phys: Elease Etienne --------------------------------------------------------------------------------  Indications: Edema.  Risk Factors: None identified. Limitations: Poor ultrasound/tissue interface, body habitus, orthopaedic appliance and patient positioning, patient immobility. Comparison Study: No prior studies. Performing Technologist: Oliver Hum RVT  Examination Guidelines: A complete evaluation includes B-mode imaging, spectral Doppler, color Doppler, and power Doppler as needed of all accessible portions of each vessel. Bilateral testing is considered an integral part of a complete examination. Limited examinations for reoccurring indications may be performed as noted. The reflux portion of the exam is performed with the patient in reverse Trendelenburg.  +---------+---------------+---------+-----------+----------+--------------+ RIGHT    CompressibilityPhasicitySpontaneityPropertiesThrombus Aging +---------+---------------+---------+-----------+----------+--------------+ CFV      Full           Yes      Yes                                 +---------+---------------+---------+-----------+----------+--------------+  SFJ      Full                                                        +---------+---------------+---------+-----------+----------+--------------+ FV Prox  Full                                                         +---------+---------------+---------+-----------+----------+--------------+ FV Mid   Full                                                        +---------+---------------+---------+-----------+----------+--------------+ FV DistalFull                                                        +---------+---------------+---------+-----------+----------+--------------+ PFV      Full                                                        +---------+---------------+---------+-----------+----------+--------------+ POP      Full           Yes      Yes                                 +---------+---------------+---------+-----------+----------+--------------+ PTV      Full                                                        +---------+---------------+---------+-----------+----------+--------------+ PERO     Full                                                        +---------+---------------+---------+-----------+----------+--------------+   +---------+---------------+---------+-----------+----------+--------------+ LEFT     CompressibilityPhasicitySpontaneityPropertiesThrombus Aging +---------+---------------+---------+-----------+----------+--------------+ CFV      Full           Yes      Yes                                 +---------+---------------+---------+-----------+----------+--------------+ SFJ      Full                                                        +---------+---------------+---------+-----------+----------+--------------+  FV Prox  Full                                                        +---------+---------------+---------+-----------+----------+--------------+ FV Mid   Full                                                        +---------+---------------+---------+-----------+----------+--------------+ FV DistalFull                                                         +---------+---------------+---------+-----------+----------+--------------+ PFV      Full                                                        +---------+---------------+---------+-----------+----------+--------------+ POP      Full           Yes      Yes                                 +---------+---------------+---------+-----------+----------+--------------+ PTV      Full                                                        +---------+---------------+---------+-----------+----------+--------------+ PERO     Full                                                        +---------+---------------+---------+-----------+----------+--------------+     Summary: RIGHT: - There is no evidence of deep vein thrombosis in the lower extremity.  - No cystic structure found in the popliteal fossa.  LEFT: - There is no evidence of deep vein thrombosis in the lower extremity.  - No cystic structure found in the popliteal fossa.  *See table(s) above for measurements and observations. Electronically signed by Jamelle Haring on 05/03/2021 at 5:49:46 PM.    Final     Microbiology No results found for this or any previous visit (from the past 240 hour(s)).  Lab Basic Metabolic Panel: No results for input(s): NA, K, CL, CO2, GLUCOSE, BUN, CREATININE, CALCIUM, MG, PHOS in the last 168 hours. Liver Function Tests: No results for input(s): AST, ALT, ALKPHOS, BILITOT, PROT, ALBUMIN in the last 168 hours. No results for input(s): LIPASE, AMYLASE in the last 168 hours. No results for input(s): AMMONIA in the last 168 hours. CBC: No results for input(s): WBC, NEUTROABS, HGB, HCT, MCV, PLT in the last 168 hours. Cardiac  Enzymes: No results for input(s): CKTOTAL, CKMB, CKMBINDEX, TROPONINI in the last 168 hours. Sepsis Labs: No results for input(s): PROCALCITON, WBC, LATICACIDVEN in the last 168 hours.  Procedures/Operations  Mechanical ventilation, long-term EEG.   Danny Patrick 05/24/2021,  12:31 PM
